# Patient Record
Sex: Female | Born: 1975 | Race: Black or African American | Hispanic: No | Smoking: Never smoker
Health system: Southern US, Community
[De-identification: ages and names within clinical notes are randomized; demographics above are authoritative.]

## PROBLEM LIST (undated history)

## (undated) DIAGNOSIS — O139 Gestational [pregnancy-induced] hypertension without significant proteinuria, unspecified trimester: Secondary | ICD-10-CM

## (undated) DIAGNOSIS — E669 Obesity, unspecified: Secondary | ICD-10-CM

## (undated) HISTORY — PX: NO PAST SURGERIES: SHX2092

## (undated) HISTORY — DX: Gestational (pregnancy-induced) hypertension without significant proteinuria, unspecified trimester: O13.9

---
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
## 2001-03-11 ENCOUNTER — Other Ambulatory Visit: Admission: RE | Admit: 2001-03-11 | Discharge: 2001-03-11 | Payer: Self-pay | Admitting: Obstetrics and Gynecology

## 2001-08-10 ENCOUNTER — Inpatient Hospital Stay (HOSPITAL_COMMUNITY): Admission: AD | Admit: 2001-08-10 | Discharge: 2001-08-13 | Payer: Self-pay | Admitting: Obstetrics and Gynecology

## 2001-10-20 ENCOUNTER — Encounter: Admission: RE | Admit: 2001-10-20 | Discharge: 2001-10-20 | Payer: Self-pay | Admitting: Obstetrics and Gynecology

## 2001-10-21 ENCOUNTER — Ambulatory Visit (HOSPITAL_COMMUNITY): Admission: RE | Admit: 2001-10-21 | Discharge: 2001-10-21 | Payer: Self-pay | Admitting: *Deleted

## 2001-10-21 ENCOUNTER — Encounter: Payer: Self-pay | Admitting: Obstetrics and Gynecology

## 2003-02-01 ENCOUNTER — Emergency Department (HOSPITAL_COMMUNITY): Admission: EM | Admit: 2003-02-01 | Discharge: 2003-02-01 | Payer: Self-pay | Admitting: Emergency Medicine

## 2003-09-25 ENCOUNTER — Other Ambulatory Visit: Admission: RE | Admit: 2003-09-25 | Discharge: 2003-09-25 | Payer: Self-pay | Admitting: Obstetrics and Gynecology

## 2004-03-22 ENCOUNTER — Inpatient Hospital Stay (HOSPITAL_COMMUNITY): Admission: AD | Admit: 2004-03-22 | Discharge: 2004-03-22 | Payer: Self-pay | Admitting: *Deleted

## 2004-03-23 ENCOUNTER — Inpatient Hospital Stay (HOSPITAL_COMMUNITY): Admission: AD | Admit: 2004-03-23 | Discharge: 2004-03-26 | Payer: Self-pay | Admitting: Family Medicine

## 2004-03-23 ENCOUNTER — Ambulatory Visit: Payer: Self-pay | Admitting: Family Medicine

## 2004-03-24 ENCOUNTER — Encounter (INDEPENDENT_AMBULATORY_CARE_PROVIDER_SITE_OTHER): Payer: Self-pay | Admitting: *Deleted

## 2007-06-02 ENCOUNTER — Emergency Department (HOSPITAL_COMMUNITY): Admission: EM | Admit: 2007-06-02 | Discharge: 2007-06-02 | Payer: Self-pay | Admitting: Emergency Medicine

## 2008-10-23 ENCOUNTER — Inpatient Hospital Stay (HOSPITAL_COMMUNITY): Admission: AD | Admit: 2008-10-23 | Discharge: 2008-10-25 | Payer: Self-pay | Admitting: Obstetrics

## 2010-02-07 ENCOUNTER — Ambulatory Visit (HOSPITAL_COMMUNITY): Admission: RE | Admit: 2010-02-07 | Discharge: 2010-02-07 | Payer: Self-pay | Admitting: Family Medicine

## 2010-10-08 LAB — CBC
HCT: 31.1 % — ABNORMAL LOW (ref 36.0–46.0)
Hemoglobin: 10.5 g/dL — ABNORMAL LOW (ref 12.0–15.0)
MCHC: 33.8 g/dL (ref 30.0–36.0)
MCV: 83.3 fL (ref 78.0–100.0)
Platelets: 205 10*3/uL (ref 150–400)
Platelets: 219 10*3/uL (ref 150–400)
RBC: 3.49 MIL/uL — ABNORMAL LOW (ref 3.87–5.11)
RBC: 3.73 MIL/uL — ABNORMAL LOW (ref 3.87–5.11)
RDW: 13.3 % (ref 11.5–15.5)
WBC: 10.8 10*3/uL — ABNORMAL HIGH (ref 4.0–10.5)
WBC: 8.1 10*3/uL (ref 4.0–10.5)

## 2010-10-08 LAB — RPR: RPR Ser Ql: NONREACTIVE

## 2011-03-26 ENCOUNTER — Inpatient Hospital Stay (INDEPENDENT_AMBULATORY_CARE_PROVIDER_SITE_OTHER)
Admission: RE | Admit: 2011-03-26 | Discharge: 2011-03-26 | Disposition: A | Discharge status: Home / Self Care | Payer: Self-pay | Source: Ambulatory Visit | Attending: Family Medicine | Admitting: Family Medicine

## 2011-03-26 DIAGNOSIS — IMO0002 Reserved for concepts with insufficient information to code with codable children: Secondary | ICD-10-CM

## 2011-04-06 LAB — COMPREHENSIVE METABOLIC PANEL WITH GFR
Albumin: 3.9
BUN: 6
Chloride: 104
Creatinine, Ser: 0.55
GFR calc non Af Amer: >60
Total Bilirubin: 1.5 — ABNORMAL HIGH

## 2011-04-06 LAB — COMPREHENSIVE METABOLIC PANEL
ALT: 25
AST: 45 — ABNORMAL HIGH
Alkaline Phosphatase: 54
CO2: 24
Calcium: 8.8
GFR calc Af Amer: >60
Glucose, Bld: 80
Potassium: 4.8
Sodium: 134 — ABNORMAL LOW
Total Protein: 7

## 2011-04-06 LAB — POCT PREGNANCY, URINE
Operator id: 239701
Preg Test, Ur: NEGATIVE

## 2011-04-06 LAB — URINALYSIS, ROUTINE W REFLEX MICROSCOPIC
Bilirubin Urine: NEGATIVE
Glucose, UA: NEGATIVE
Hgb urine dipstick: NEGATIVE
Ketones, ur: 15 — AB
Nitrite: NEGATIVE
Protein, ur: NEGATIVE
Specific Gravity, Urine: 1.007
Urobilinogen, UA: 1
pH: 7

## 2011-04-06 LAB — DIFFERENTIAL
Basophils Absolute: 0
Basophils Relative: 1
Eosinophils Absolute: 0.1 — ABNORMAL LOW
Eosinophils Relative: 2
Lymphocytes Relative: 36
Lymphs Abs: 1.9
Monocytes Absolute: 0.3
Monocytes Relative: 6
Neutro Abs: 3.1
Neutrophils Relative %: 56

## 2011-04-06 LAB — POCT URINALYSIS DIP (DEVICE)
Bilirubin Urine: NEGATIVE
Glucose, UA: NEGATIVE
Nitrite: NEGATIVE
Urobilinogen, UA: 0.2

## 2011-04-06 LAB — CBC
HCT: 37
Hemoglobin: 12.6
MCHC: 34.1
MCV: 82.9
Platelets: 295
RBC: 4.47
RDW: 13.2
WBC: 5.4

## 2011-04-06 LAB — LIPASE, BLOOD: Lipase: 15

## 2011-05-19 ENCOUNTER — Encounter: Payer: Self-pay | Admitting: Emergency Medicine

## 2011-05-19 ENCOUNTER — Emergency Department (HOSPITAL_COMMUNITY)
Admission: EM | Admit: 2011-05-19 | Discharge: 2011-05-19 | Disposition: A | Discharge status: Home / Self Care | Payer: Self-pay | Attending: Emergency Medicine | Admitting: Emergency Medicine

## 2011-05-19 ENCOUNTER — Emergency Department (HOSPITAL_COMMUNITY): Payer: Self-pay

## 2011-05-19 DIAGNOSIS — X500XXA Overexertion from strenuous movement or load, initial encounter: Secondary | ICD-10-CM | POA: Insufficient documentation

## 2011-05-19 DIAGNOSIS — M25569 Pain in unspecified knee: Secondary | ICD-10-CM | POA: Insufficient documentation

## 2011-05-19 DIAGNOSIS — IMO0002 Reserved for concepts with insufficient information to code with codable children: Secondary | ICD-10-CM | POA: Insufficient documentation

## 2011-05-19 DIAGNOSIS — S8390XA Sprain of unspecified site of unspecified knee, initial encounter: Secondary | ICD-10-CM

## 2011-05-19 MED ORDER — HYDROCODONE-ACETAMINOPHEN 5-325 MG PO TABS: 2.0000 | ORAL_TABLET | ORAL | Status: AC | PRN

## 2011-05-19 MED ORDER — IBUPROFEN 600 MG PO TABS: 600.0000 mg | ORAL_TABLET | Freq: Four times a day (QID) | ORAL | Status: AC | PRN

## 2011-05-19 NOTE — ED Notes (Signed)
Hurt her left knee a while ago and today hurts worse and  Heard a crack she states

## 2011-05-19 NOTE — ED Provider Notes (Signed)
History     CSN: [id number] Arrival date & time: 05/19/2011  4:16 PM   First MD Initiated Contact with Patient 05/19/11 1710      Chief Complaint  Patient presents with  . Knee Pain    (Consider location/radiation/quality/duration/timing/severity/associated sxs/prior treatment) Patient is a 35 y.o. female presenting with knee pain. The history is provided by the patient.  Knee Pain This is a new problem. The current episode started 12 to 24 hours ago. The problem occurs constantly. The problem has not changed since onset.The symptoms are aggravated by walking and standing. The symptoms are relieved by nothing. She has tried nothing for the symptoms.  pain localized to left knee, started after pt turned and twisted--h/o chronic knee pain  History reviewed. No pertinent past medical history.  History reviewed. No pertinent past surgical history.  No family history on file.  History  Substance Use Topics  . Smoking status: Not on file  . Smokeless tobacco: Not on file  . Alcohol Use: Not on file    OB History    Grav Para Term Preterm Abortions TAB SAB Ect Mult Living                  Review of Systems  All other systems reviewed and are negative.    Allergies  Review of patient's allergies indicates no known allergies.  Home Medications  No current outpatient prescriptions on file.  BP 135/79  Pulse 74  Temp(Src) 98.3 F (36.8 C) (Oral)  Resp 20  SpO2 100%  Physical Exam  Nursing note and vitals reviewed. Constitutional: She is oriented to person, place, and time. She appears well-developed and well-nourished.  Non-toxic appearance.  HENT:  Head: Normocephalic and atraumatic.  Eyes: Conjunctivae are normal. Pupils are equal, round, and reactive to light.  Neck: Normal range of motion.  Cardiovascular: Normal rate.   Pulmonary/Chest: Effort normal.  Musculoskeletal:       Left knee: She exhibits bony tenderness and MCL laxity. She exhibits no swelling,  no effusion, no deformity and no erythema. tenderness found.       Legs: Neurological: She is alert and oriented to person, place, and time.  Skin: Skin is warm and dry.  Psychiatric: She has a normal mood and affect.    ED Course  Procedures (including critical care time)  Labs Reviewed - No data to display No results found.   No diagnosis found.    MDM  Will give pt crutches and knee brace, given ortho referral        Toy Baker, MD 05/19/11 [phone]

## 2011-05-19 NOTE — Progress Notes (Signed)
Orthopedic Tech Progress Note Patient Details:  Sherri Butler [birth year] [id number]  Other Ortho Devices Type of Ortho Device: Knee Sleeve;Crutches Ortho Device Location: (L) LE Ortho Device Interventions: Application   [name] 05/19/2011, 6:32 PM

## 2011-07-01 ENCOUNTER — Other Ambulatory Visit (HOSPITAL_COMMUNITY): Payer: Self-pay | Admitting: Orthopedic Surgery

## 2011-07-01 DIAGNOSIS — M25562 Pain in left knee: Secondary | ICD-10-CM

## 2011-07-03 ENCOUNTER — Ambulatory Visit (HOSPITAL_COMMUNITY)
Admission: RE | Admit: 2011-07-03 | Discharge: 2011-07-03 | Disposition: A | Discharge status: Home / Self Care | Payer: Self-pay | Source: Ambulatory Visit | Attending: Orthopedic Surgery | Admitting: Orthopedic Surgery

## 2011-07-03 DIAGNOSIS — W19XXXA Unspecified fall, initial encounter: Secondary | ICD-10-CM | POA: Insufficient documentation

## 2011-07-03 DIAGNOSIS — M25569 Pain in unspecified knee: Secondary | ICD-10-CM | POA: Insufficient documentation

## 2011-07-03 DIAGNOSIS — IMO0002 Reserved for concepts with insufficient information to code with codable children: Secondary | ICD-10-CM | POA: Insufficient documentation

## 2011-07-03 DIAGNOSIS — M25562 Pain in left knee: Secondary | ICD-10-CM

## 2011-07-03 DIAGNOSIS — M25469 Effusion, unspecified knee: Secondary | ICD-10-CM | POA: Insufficient documentation

## 2012-07-21 ENCOUNTER — Encounter (HOSPITAL_COMMUNITY): Payer: Self-pay | Admitting: Emergency Medicine

## 2012-07-21 ENCOUNTER — Emergency Department (HOSPITAL_COMMUNITY)
Admission: EM | Admit: 2012-07-21 | Discharge: 2012-07-21 | Disposition: A | Discharge status: Home / Self Care | Payer: No Typology Code available for payment source | Source: Home / Self Care | Attending: Family Medicine | Admitting: Family Medicine

## 2012-07-21 DIAGNOSIS — J40 Bronchitis, not specified as acute or chronic: Secondary | ICD-10-CM

## 2012-07-21 DIAGNOSIS — J069 Acute upper respiratory infection, unspecified: Secondary | ICD-10-CM

## 2012-07-21 MED ORDER — PREDNISONE 20 MG PO TABS: ORAL_TABLET | ORAL | Status: DC

## 2012-07-21 MED ORDER — PROMETHAZINE HCL 25 MG PO TABS: 25.0000 mg | ORAL_TABLET | Freq: Three times a day (TID) | ORAL | Status: DC | PRN

## 2012-07-21 MED ORDER — CETIRIZINE-PSEUDOEPHEDRINE ER 5-120 MG PO TB12: 1.0000 | ORAL_TABLET | Freq: Two times a day (BID) | ORAL | Status: DC

## 2012-07-21 MED ORDER — HYDROCODONE-ACETAMINOPHEN 7.5-325 MG/15ML PO SOLN: 15.0000 mL | Freq: Three times a day (TID) | ORAL | Status: DC | PRN

## 2012-07-21 MED ORDER — IBUPROFEN 600 MG PO TABS: 600.0000 mg | ORAL_TABLET | Freq: Three times a day (TID) | ORAL | Status: DC | PRN

## 2012-07-21 NOTE — ED Notes (Signed)
Pt is here for cold sx x4 days.  Sx include: sore throat, dry cough, chest discomfort due to cough, fever, vomiting, nauseas, chills Denies: diarrhea, dysphagia Has taken Theraflu to w/little relief  She is alert w/no signs of acute distress.

## 2012-07-21 NOTE — ED Notes (Signed)
Pt is in the room drinking coffee.

## 2012-07-21 NOTE — ED Provider Notes (Signed)
History     CSN: [id number]  Arrival date & time 07/21/12  1230   First MD Initiated Contact with Patient 07/21/12 1256      Chief Complaint  Patient presents with  . URI    (Consider location/radiation/quality/duration/timing/severity/associated sxs/prior treatment) HPI Comments: 37 year old female with no significant past medical history. Here complaining of 4 days with sore throat, nasal congestion, sinus pressure and dry cough with coughing spells worse at nighttime associated with wheezing, nausea and vomiting. Symptoms also associated with chills and fever. Denies abdominal pain or diarrhea. Has been able to keep fluids down today. Patient taking TheraFlu over-the-counter with minimal improvement.   History reviewed. No pertinent past medical history.  History reviewed. No pertinent past surgical history.  No family history on file.  History  Substance Use Topics  . Smoking status: Not on file  . Smokeless tobacco: Not on file  . Alcohol Use: Not on file    OB History    Grav Para Term Preterm Abortions TAB SAB Ect Mult Living                  Review of Systems  Constitutional: Positive for fever, chills and appetite change. Negative for diaphoresis, fatigue and unexpected weight change.  HENT: Positive for ear pain, congestion, sore throat, rhinorrhea and sinus pressure. Negative for ear discharge.   Eyes: Negative for discharge.  Respiratory: Positive for cough and wheezing. Negative for chest tightness and shortness of breath.   Cardiovascular: Negative for chest pain and leg swelling.  Gastrointestinal: Positive for nausea and vomiting. Negative for abdominal pain.  Genitourinary: Negative for dysuria.  Neurological: Negative for dizziness and headaches.  All other systems reviewed and are negative.    Allergies  Review of patient's allergies indicates no known allergies.  Home Medications   Current Outpatient Rx  Name  Route  Sig  Dispense  Refill    . CETIRIZINE-PSEUDOEPHEDRINE ER 5-120 MG PO TB12   Oral   Take 1 tablet by mouth 2 (two) times daily.   30 tablet   0   . HYDROCODONE-ACETAMINOPHEN 7.5-325 MG/15ML PO SOLN   Oral   Take 15 mLs by mouth every 8 (eight) hours as needed for pain or cough.   120 mL   0   . IBUPROFEN 600 MG PO TABS   Oral   Take 1 tablet (600 mg total) by mouth every 8 (eight) hours as needed for pain or fever.   20 tablet   0   . PREDNISONE 20 MG PO TABS      2 tabs po daily for 5 days   10 tablet   0   . PROMETHAZINE HCL 25 MG PO TABS   Oral   Take 1 tablet (25 mg total) by mouth every 8 (eight) hours as needed for nausea.   10 tablet   0     BP 130/82  Pulse 88  Temp 98.5 F (36.9 C) (Oral)  Resp 16  SpO2 100%  Physical Exam  Nursing note and vitals reviewed. Constitutional: She is oriented to person, place, and time. She appears well-developed and well-nourished. No distress.  HENT:  Head: Normocephalic and atraumatic.       Nasal Congestion with severe erythema and swelling of nasal turbinates, clear rhinorrhea. Nasal voice.  pharyngeal erythema, postnasal drip no exudates. No uvula deviation. No trismus. TM's impress clear fluid behind right TM. Left TM appears normal.  Eyes: Conjunctivae normal are normal. Right eye exhibits no  discharge. Left eye exhibits no discharge. No scleral icterus.  Neck: Neck supple. No JVD present. No thyromegaly present.  Cardiovascular: Normal rate, regular rhythm and normal heart sounds.  Exam reveals no gallop and no friction rub.   No murmur heard. Pulmonary/Chest: Effort normal. She exhibits no tenderness.       Scattered expiratory rhonchi. No active wheezing. No orthopnea or tachypnea. No rales.  Abdominal: Soft. She exhibits no distension and no mass. There is no tenderness. There is no rebound and no guarding.  Lymphadenopathy:    She has no cervical adenopathy.  Neurological: She is alert and oriented to person, place, and time.  Skin:  No rash noted. She is not diaphoretic.    ED Course  Procedures (including critical care time)  Labs Reviewed - No data to display No results found.   1. Bronchitis   2. URI (upper respiratory infection)       MDM  Impress viral upper respiratory infection and bronchitis. Prescribed prednisone, cetirizine/pseudoephedrine, ibuprofen, hydrocodone and/acetaminophen and Phenergan. Supportive care and red flags that should prompt her return to medical attention discussed with patient and provided in writing.        Sharin Grave, MD 07/22/12 1210

## 2013-05-12 ENCOUNTER — Ambulatory Visit: Payer: No Typology Code available for payment source

## 2013-06-06 ENCOUNTER — Encounter (HOSPITAL_COMMUNITY): Payer: Self-pay | Admitting: Emergency Medicine

## 2013-06-06 ENCOUNTER — Emergency Department (HOSPITAL_COMMUNITY)
Admission: EM | Admit: 2013-06-06 | Discharge: 2013-06-06 | Disposition: A | Discharge status: Home / Self Care | Payer: No Typology Code available for payment source | Source: Home / Self Care | Attending: Family Medicine | Admitting: Family Medicine

## 2013-06-06 DIAGNOSIS — J069 Acute upper respiratory infection, unspecified: Secondary | ICD-10-CM

## 2013-06-06 MED ORDER — BENZONATATE 100 MG PO CAPS: 100.0000 mg | ORAL_CAPSULE | Freq: Three times a day (TID) | ORAL | Status: DC

## 2013-06-06 NOTE — ED Notes (Signed)
C/o sore throat.  Productive cough with with red tinged sputum. Chest soreness. And fever.  Denies n/v/d.  Mild relief with otc meds.  Onset 2 wks ago.

## 2013-06-06 NOTE — ED Provider Notes (Signed)
CSN: [id number]     Arrival date & time 06/06/13  4098 History   First MD Initiated Contact with Patient 06/06/13 [phone]     Chief Complaint  Patient presents with  . URI   (Consider location/radiation/quality/duration/timing/severity/associated sxs/prior Treatment) HPI Comments: Patient states, "I have a cold."  Patient is a 37 y.o. female presenting with URI. The history is provided by the patient.  URI Presenting symptoms: congestion, cough, rhinorrhea and sore throat   Presenting symptoms: no ear pain, no facial pain, no fatigue and no fever   Severity:  Mild Onset quality:  Gradual Duration:  3 days Timing:  Constant Progression:  Unchanged Chronicity:  New Relieved by:  OTC medications Associated symptoms: no arthralgias, no headaches, no myalgias, no neck pain, no sinus pain, no sneezing, no swollen glands and no wheezing   Risk factors: not elderly, no immunosuppression, no recent illness, no recent travel and no sick contacts     History reviewed. No pertinent past medical history. History reviewed. No pertinent past surgical history. History reviewed. No pertinent family history. History  Substance Use Topics  . Smoking status: Never Smoker   . Smokeless tobacco: Not on file  . Alcohol Use: No   OB History   Grav Para Term Preterm Abortions TAB SAB Ect Mult Living                 Review of Systems  Constitutional: Negative for fever and fatigue.  HENT: Positive for congestion, rhinorrhea and sore throat. Negative for ear pain and sneezing.   Respiratory: Positive for cough. Negative for wheezing.   Musculoskeletal: Negative for arthralgias, myalgias and neck pain.  Neurological: Negative for headaches.  All other systems reviewed and are negative.    Allergies  Review of patient's allergies indicates no known allergies.  Home Medications   Current Outpatient Rx  Name  Route  Sig  Dispense  Refill  . benzonatate (TESSALON) 100 MG capsule   Oral   Take  1 capsule (100 mg total) by mouth every 8 (eight) hours.   21 capsule   0   . cetirizine-pseudoephedrine (ZYRTEC-D) 5-120 MG per tablet   Oral   Take 1 tablet by mouth 2 (two) times daily.   30 tablet   0   . HYDROcodone-acetaminophen (HYCET) 7.5-325 mg/15 ml solution   Oral   Take 15 mLs by mouth every 8 (eight) hours as needed for pain or cough.   120 mL   0   . ibuprofen (ADVIL,MOTRIN) 600 MG tablet   Oral   Take 1 tablet (600 mg total) by mouth every 8 (eight) hours as needed for pain or fever.   20 tablet   0   . predniSONE (DELTASONE) 20 MG tablet      2 tabs po daily for 5 days   10 tablet   0   . promethazine (PHENERGAN) 25 MG tablet   Oral   Take 1 tablet (25 mg total) by mouth every 8 (eight) hours as needed for nausea.   10 tablet   0    BP 119/81  Pulse 68  Temp(Src) 98.1 F (36.7 C) (Oral)  Resp 16  SpO2 100%  LMP 05/07/2013 Physical Exam  Nursing note and vitals reviewed. Constitutional: She is oriented to person, place, and time. She appears well-developed and well-nourished. No distress.  HENT:  Head: Normocephalic and atraumatic.  Right Ear: Hearing, tympanic membrane, external ear and ear canal normal.  Left Ear: Hearing, tympanic membrane,  external ear and ear canal normal.  Nose: Nose normal.  Mouth/Throat: Uvula is midline, oropharynx is clear and moist and mucous membranes are normal.  Eyes: Conjunctivae are normal. Pupils are equal, round, and reactive to light. Right eye exhibits no discharge. Left eye exhibits no discharge. No scleral icterus.  Neck: Normal range of motion. Neck supple. No thyromegaly present.  Cardiovascular: Normal rate, regular rhythm and normal heart sounds.   Pulmonary/Chest: Effort normal and breath sounds normal. No respiratory distress.  Abdominal: Soft. Bowel sounds are normal. There is no tenderness.  Musculoskeletal: Normal range of motion.  Lymphadenopathy:    She has no cervical adenopathy.   Neurological: She is alert and oriented to person, place, and time.  Skin: Skin is warm and dry. No rash noted.  Psychiatric: She has a normal mood and affect. Her behavior is normal.    ED Course  Procedures (including critical care time) Labs Review Labs Reviewed  POCT RAPID STREP A ([facility] CARE ONLY)   Imaging Review No results found.  EKG Interpretation    Date/Time:    Ventricular Rate:    PR Interval:    QRS Duration:   QT Interval:    QTC Calculation:   R Axis:     Text Interpretation:              MDM   1. URI (upper respiratory infection)     Rapid strep negative. Advised patient regarding symptomatic care and expectation for resolution of symptoms at home. Instructed her to follow up with PCP if symptoms do not begin to improve over the next 7-10 days or if she develops fever > 101.    Jess Barters [place], [place] 06/06/13 1032

## 2013-06-07 NOTE — ED Provider Notes (Signed)
Medical screening examination/treatment/procedure(s) were performed by a resident physician or non-physician practitioner and as the supervising physician I was immediately available for consultation/collaboration.  Clementeen Graham, MD    Rodolph Bong, MD 06/07/13 [phone]

## 2013-06-08 LAB — CULTURE, GROUP A STREP

## 2014-05-15 LAB — CYTOLOGY - PAP: Pap: NEGATIVE

## 2014-06-05 LAB — OB RESULTS CONSOLE ABO/RH: RH Type: POSITIVE

## 2014-06-05 LAB — OB RESULTS CONSOLE GC/CHLAMYDIA
Chlamydia: NEGATIVE
GC PROBE AMP, GENITAL: NEGATIVE

## 2014-06-05 LAB — OB RESULTS CONSOLE HEPATITIS B SURFACE ANTIGEN: Hepatitis B Surface Ag: NEGATIVE

## 2014-06-05 LAB — OB RESULTS CONSOLE RUBELLA ANTIBODY, IGM: Rubella: IMMUNE

## 2014-06-05 LAB — OB RESULTS CONSOLE RPR: RPR: NONREACTIVE

## 2014-06-05 LAB — OB RESULTS CONSOLE HIV ANTIBODY (ROUTINE TESTING): HIV: NONREACTIVE

## 2014-06-05 LAB — OB RESULTS CONSOLE ANTIBODY SCREEN: ANTIBODY SCREEN: NEGATIVE

## 2014-06-29 DIAGNOSIS — O139 Gestational [pregnancy-induced] hypertension without significant proteinuria, unspecified trimester: Secondary | ICD-10-CM | POA: Insufficient documentation

## 2014-06-29 HISTORY — DX: Gestational (pregnancy-induced) hypertension without significant proteinuria, unspecified trimester: O13.9

## 2014-06-29 NOTE — L&D Delivery Note (Signed)
Delivery Note At 9:52 PM a viable female was delivered via Vaginal, Spontaneous Delivery (Presentation: Right Occiput Anterior).  APGAR: 9, 9; weight  .   Placenta status: , .  Cord: 3 vessels with the following complications: None.  Cord pH: not done  Anesthesia: Epidural  Episiotomy: None Lacerations: None Suture Repair: 2.0 Est. Blood Loss (mL):    Mom to postpartum.  Baby to Couplet care / Skin to Skin.  Tito Ausmus A 12/09/2014, 10:09 PM

## 2014-07-10 ENCOUNTER — Other Ambulatory Visit (HOSPITAL_COMMUNITY): Payer: Self-pay | Admitting: Obstetrics

## 2014-07-10 DIAGNOSIS — Z3689 Encounter for other specified antenatal screening: Secondary | ICD-10-CM

## 2014-07-10 DIAGNOSIS — O09522 Supervision of elderly multigravida, second trimester: Secondary | ICD-10-CM

## 2014-07-10 DIAGNOSIS — Z3A20 20 weeks gestation of pregnancy: Secondary | ICD-10-CM

## 2014-07-19 ENCOUNTER — Encounter (HOSPITAL_COMMUNITY): Payer: Self-pay

## 2014-07-19 ENCOUNTER — Ambulatory Visit (HOSPITAL_COMMUNITY)
Admission: RE | Admit: 2014-07-19 | Discharge: 2014-07-19 | Disposition: A | Discharge status: Home / Self Care | Payer: Self-pay | Source: Ambulatory Visit | Attending: Obstetrics | Admitting: Obstetrics

## 2014-07-19 DIAGNOSIS — Z3689 Encounter for other specified antenatal screening: Secondary | ICD-10-CM | POA: Insufficient documentation

## 2014-07-19 DIAGNOSIS — O09522 Supervision of elderly multigravida, second trimester: Secondary | ICD-10-CM

## 2014-07-19 DIAGNOSIS — Z3A20 20 weeks gestation of pregnancy: Secondary | ICD-10-CM

## 2014-07-19 DIAGNOSIS — O09529 Supervision of elderly multigravida, unspecified trimester: Secondary | ICD-10-CM | POA: Insufficient documentation

## 2014-07-19 DIAGNOSIS — Z315 Encounter for genetic counseling: Secondary | ICD-10-CM | POA: Insufficient documentation

## 2014-07-19 NOTE — Progress Notes (Signed)
Genetic Counseling  High-Risk Gestation Note  Appointment Date:  07/19/2014 Referred By: Kathreen CosierMarshall, Bernard A, MD Date of Birth:  Jul 27, 1975   Pregnancy History: [id number] Estimated Date of Delivery: 12/06/14 Estimated Gestational Age: 5519w0d Attending: Alpha GulaPaul Whitecar, MD   [name] was seen for genetic counseling because of a maternal age of 39 y.o..     She was counseled regarding maternal age and the association with risk for chromosome conditions due to nondisjunction with aging of the ova.   We reviewed chromosomes, nondisjunction, and the associated 1 in 111 risk for fetal aneuploidy related to a maternal age of 39 y.o. at 4719w0d gestation.  She was/They were counseled that the risk for aneuploidy decreases as gestational age increases, accounting for those pregnancies which spontaneously abort.  We specifically discussed Down syndrome (trisomy 4721), trisomies 6613 and 4418, and sex chromosome aneuploidies (47,XXX and 47,XXY) including the common features and prognoses of each.   We reviewed available screening options including Quad screen, noninvasive prenatal screening (NIPS)/cell free DNA (cfDNA) testing, and detailed ultrasound.  She was counseled that screening tests are used to modify a patient's a priori risk for aneuploidy, typically based on age. This estimate provides a pregnancy specific risk assessment. We reviewed the benefits and limitations of each option. Specifically, we discussed the conditions for which each test screens, the detection rates, and false positive rates of each. She was also counseled regarding diagnostic testing via amniocentesis. We reviewed the approximate 1 in 300-500 risk for complications for amniocentesis, including spontaneous pregnancy loss. After consideration of all the options, she elected to proceed with targeted ultrasound only and declined Quad screen, NIPS, and amniocentesis.    A complete ultrasound was performed today. The ultrasound report will be  sent under separate cover. There were no visualized fetal anomalies or markers suggestive of aneuploidy. Diagnostic testing was declined today.  She understands that screening tests cannot rule out all birth defects or genetic syndromes. The patient was advised of this limitation and states she still does not want additional testing at this time.   Ms. Fonnie MuHaoua Koogler was provided with written information regarding sickle cell anemia (SCA) including the carrier frequency and incidence in the African population, the availability of carrier testing and prenatal diagnosis if indicated.  In addition, we discussed that hemoglobinopathies are routinely screened for as part of the [place] newborn screening panel.  OB medical records indicated that sickle cell screening was previously performed and was negative. Hemoglobin electrophoresis is available to screen for additional hemoglobin variants, if not previously performed and if desired by the patient.   Both family histories were reviewed and found to be noncontributory for birth defects, intellectual disability, and known genetic conditions. Without further information regarding the provided family history, an accurate genetic risk cannot be calculated. Further genetic counseling is warranted if more information is obtained.  Ms. Fonnie MuHaoua Huyser denied exposure to environmental toxins or chemical agents. She denied the use of alcohol, tobacco or street drugs. She denied significant viral illnesses during the course of her pregnancy. Her medical and surgical histories were noncontributory.   I counseled Ms. Carin Duplessis regarding the above risks and available options.  The approximate face-to-face time with the genetic counselor was 35 minutes.  Quinn PlowmanKaren Franceen Erisman, MS,  Certified Genetic Counselor 07/19/2014

## 2014-08-17 ENCOUNTER — Ambulatory Visit (HOSPITAL_COMMUNITY)
Admission: RE | Admit: 2014-08-17 | Discharge: 2014-08-17 | Disposition: A | Discharge status: Home / Self Care | Payer: Self-pay | Source: Ambulatory Visit | Attending: Obstetrics | Admitting: Obstetrics

## 2014-08-17 ENCOUNTER — Encounter (HOSPITAL_COMMUNITY): Payer: Self-pay

## 2014-08-17 DIAGNOSIS — Z0489 Encounter for examination and observation for other specified reasons: Secondary | ICD-10-CM | POA: Insufficient documentation

## 2014-08-17 DIAGNOSIS — O09522 Supervision of elderly multigravida, second trimester: Secondary | ICD-10-CM

## 2014-08-17 DIAGNOSIS — IMO0002 Reserved for concepts with insufficient information to code with codable children: Secondary | ICD-10-CM | POA: Insufficient documentation

## 2014-08-17 DIAGNOSIS — Z3A24 24 weeks gestation of pregnancy: Secondary | ICD-10-CM | POA: Insufficient documentation

## 2014-08-17 DIAGNOSIS — Z36 Encounter for antenatal screening of mother: Secondary | ICD-10-CM | POA: Insufficient documentation

## 2014-08-27 ENCOUNTER — Inpatient Hospital Stay (HOSPITAL_COMMUNITY)
Admission: AD | Admit: 2014-08-27 | Discharge: 2014-08-27 | Disposition: A | Discharge status: Home / Self Care | Payer: Self-pay | Source: Ambulatory Visit | Attending: Obstetrics | Admitting: Obstetrics

## 2014-08-27 ENCOUNTER — Encounter (HOSPITAL_COMMUNITY): Payer: Self-pay | Admitting: General Practice

## 2014-08-27 DIAGNOSIS — S3992XA Unspecified injury of lower back, initial encounter: Secondary | ICD-10-CM

## 2014-08-27 DIAGNOSIS — O9989 Other specified diseases and conditions complicating pregnancy, childbirth and the puerperium: Secondary | ICD-10-CM | POA: Insufficient documentation

## 2014-08-27 DIAGNOSIS — O09522 Supervision of elderly multigravida, second trimester: Secondary | ICD-10-CM | POA: Insufficient documentation

## 2014-08-27 DIAGNOSIS — W108XXA Fall (on) (from) other stairs and steps, initial encounter: Secondary | ICD-10-CM

## 2014-08-27 DIAGNOSIS — O9A212 Injury, poisoning and certain other consequences of external causes complicating pregnancy, second trimester: Secondary | ICD-10-CM

## 2014-08-27 DIAGNOSIS — Z3A25 25 weeks gestation of pregnancy: Secondary | ICD-10-CM | POA: Insufficient documentation

## 2014-08-27 DIAGNOSIS — W19XXXA Unspecified fall, initial encounter: Secondary | ICD-10-CM

## 2014-08-27 DIAGNOSIS — W109XXA Fall (on) (from) unspecified stairs and steps, initial encounter: Secondary | ICD-10-CM | POA: Insufficient documentation

## 2014-08-27 LAB — ABO/RH: ABO/RH(D): O POS

## 2014-08-27 NOTE — MAU Provider Note (Signed)
History     CSN: [id number]  Arrival date and time: 08/27/14 [id number]   First Provider Initiated Contact with Patient 08/27/14 [phone]      Chief Complaint  Patient presents with  . Fall   HPI   [name] is a 39 y.o. female [phone] at 6936w4d who presents following a fall that she encounters last evening at 6:00 pm.  She was coming down the stairs and missed the last two steps. She fell on her bottom; she did not hit her abdomen.  She is having pain only in her bottom; she feels the pain when she sits and when she changes positions. She denies bruising or injury.   She denies leaking of fluid or vaginal bleeding.   + fetal movements.   OB History    Gravida Para Term Preterm AB TAB SAB Ectopic Multiple Living   4 3 3       3       Past Medical History  Diagnosis Date  . Medical history non-contributory     Past Surgical History  Procedure Laterality Date  . No past surgeries      History reviewed. No pertinent family history.  History  Substance Use Topics  . Smoking status: Never Smoker   . Smokeless tobacco: Not on file  . Alcohol Use: No    Allergies: No Known Allergies  Prescriptions prior to admission  Medication Sig Dispense Refill Last Dose  . benzonatate (TESSALON) 100 MG capsule Take 1 capsule (100 mg total) by mouth every 8 (eight) hours. (Patient not taking: Reported on 07/19/2014) 21 capsule 0 Not Taking  . cetirizine-pseudoephedrine (ZYRTEC-D) 5-120 MG per tablet Take 1 tablet by mouth 2 (two) times daily. (Patient not taking: Reported on 07/19/2014) 30 tablet 0 Not Taking  . HYDROcodone-acetaminophen (HYCET) 7.5-325 mg/15 ml solution Take 15 mLs by mouth every 8 (eight) hours as needed for pain or cough. (Patient not taking: Reported on 07/19/2014) 120 mL 0 Not Taking  . ibuprofen (ADVIL,MOTRIN) 600 MG tablet Take 1 tablet (600 mg total) by mouth every 8 (eight) hours as needed for pain or fever. (Patient not taking: Reported on 07/19/2014) 20 tablet 0  Not Taking  . predniSONE (DELTASONE) 20 MG tablet 2 tabs po daily for 5 days (Patient not taking: Reported on 07/19/2014) 10 tablet 0 Not Taking  . Prenatal Multivit-Min-Fe-FA (PRENATAL VITAMINS PO) Take by mouth.   Taking  . promethazine (PHENERGAN) 25 MG tablet Take 1 tablet (25 mg total) by mouth every 8 (eight) hours as needed for nausea. (Patient not taking: Reported on 07/19/2014) 10 tablet 0 Not Taking   Results for orders placed or performed during the hospital encounter of 08/27/14 (from the past 48 hour(s))  ABO/Rh     Status: None (Preliminary result)   Collection Time: 08/27/14  9:20 AM  Result Value Ref Range   ABO/RH(D) O POS     Review of Systems  Constitutional: Negative for fever and chills.  Gastrointestinal: Negative for abdominal pain.  Musculoskeletal: Negative for back pain.   Physical Exam   Blood pressure 123/64, pulse 81, temperature 98.3 F (36.8 C), resp. rate 18, height 5\' 3"  (1.6 m).  Physical Exam  Nursing note and vitals reviewed. Constitutional: She is oriented to person, place, and time. She appears well-developed and well-nourished. No distress.  HENT:  Head: Normocephalic.  Eyes: Pupils are equal, round, and reactive to light.  Neck: Neck supple.  Cardiovascular: Normal rate and normal heart sounds.  Respiratory: Effort normal and breath sounds normal. No respiratory distress.  GI: Soft. She exhibits no distension. There is no tenderness. There is no rebound and no guarding.  Musculoskeletal: Normal range of motion.  Neurological: She is alert and oriented to person, place, and time.  Skin: Skin is warm. She is not diaphoretic.  Psychiatric: Her behavior is normal.   Fetal Tracing: Baseline: 135 bpm  Variability: Moderate  Accelerations: 10x10 Decelerations: None Toco: None  MAU Course  Procedures  None  MDM O positive  Discussed patient with Dr. Gaynell Face.   NST   Assessment and Plan   A:  1. Fall, initial encounter      P:  Discharge home in stable condition Follow up with Dr. Gaynell Face as scheduled Return to MAU with any pain or bleeding, or leaking of fluid Fetal kick counts   Iona Hansen Alexander Aument, NP 08/27/2014 10:25 AM

## 2014-08-27 NOTE — MAU Note (Signed)
Slipped going down the stairs last night.  Landed on her bottom, no pain except when she goes to sit down

## 2014-08-27 NOTE — MAU Note (Signed)
Fetus difficult to trace due to active fetal movement

## 2014-08-27 NOTE — Discharge Instructions (Signed)
Fall Prevention and Home Safety °Falls cause injuries and can affect all age groups. It is possible to use preventive measures to significantly decrease the likelihood of falls. There are many simple measures which can make your home safer and prevent falls. °OUTDOORS °· Repair cracks and edges of walkways and driveways. °· Remove high doorway thresholds. °· Trim shrubbery on the main path into your home. °· Have good outside lighting. °· Clear walkways of tools, rocks, debris, and clutter. °· Check that handrails are not broken and are securely fastened. Both sides of steps should have handrails. °· Have leaves, snow, and ice cleared regularly. °· Use sand or salt on walkways during winter months. °· In the garage, clean up grease or oil spills. °BATHROOM °· Install night lights. °· Install grab bars by the toilet and in the tub and shower. °· Use non-skid mats or decals in the tub or shower. °· Place a plastic non-slip stool in the shower to sit on, if needed. °· Keep floors dry and clean up all water on the floor immediately. °· Remove soap buildup in the tub or shower on a regular basis. °· Secure bath mats with non-slip, double-sided rug tape. °· Remove throw rugs and tripping hazards from the floors. °BEDROOMS °· Install night lights. °· Make sure a bedside light is easy to reach. °· Do not use oversized bedding. °· Keep a telephone by your bedside. °· Have a firm chair with side arms to use for getting dressed. °· Remove throw rugs and tripping hazards from the floor. °KITCHEN °· Keep handles on pots and pans turned toward the center of the stove. Use back burners when possible. °· Clean up spills quickly and allow time for drying. °· Avoid walking on wet floors. °· Avoid hot utensils and knives. °· Position shelves so they are not too high or low. °· Place commonly used objects within easy reach. °· If necessary, use a sturdy step stool with a grab bar when reaching. °· Keep electrical cables out of the  way. °· Do not use floor polish or wax that makes floors slippery. If you must use wax, use non-skid floor wax. °· Remove throw rugs and tripping hazards from the floor. °STAIRWAYS °· Never leave objects on stairs. °· Place handrails on both sides of stairways and use them. Fix any loose handrails. Make sure handrails on both sides of the stairways are as long as the stairs. °· Check carpeting to make sure it is firmly attached along stairs. Make repairs to worn or loose carpet promptly. °· Avoid placing throw rugs at the top or bottom of stairways, or properly secure the rug with carpet tape to prevent slippage. Get rid of throw rugs, if possible. °· Have an electrician put in a light switch at the top and bottom of the stairs. °OTHER FALL PREVENTION TIPS °· Wear low-heel or rubber-soled shoes that are supportive and fit well. Wear closed toe shoes. °· When using a stepladder, make sure it is fully opened and both spreaders are firmly locked. Do not climb a closed stepladder. °· Add color or contrast paint or tape to grab bars and handrails in your home. Place contrasting color strips on first and last steps. °· Learn and use mobility aids as needed. Install an electrical emergency response system. °· Turn on lights to avoid dark areas. Replace light bulbs that burn out immediately. Get light switches that glow. °· Arrange furniture to create clear pathways. Keep furniture in the same place. °·   Firmly attach carpet with non-skid or double-sided tape. °· Eliminate uneven floor surfaces. °· Select a carpet pattern that does not visually hide the edge of steps. °· Be aware of all pets. °OTHER HOME SAFETY TIPS °· Set the water temperature for 120° F (48.8° C). °· Keep emergency numbers on or near the telephone. °· Keep smoke detectors on every level of the home and near sleeping areas. °Document Released: 06/05/2002 Document Revised: 12/15/2011 Document Reviewed: 09/04/2011 °ExitCare® Patient Information ©2015  [employer]. This information is not intended to replace advice given to you by your health care provider. Make sure you discuss any questions you have with your health care provider. ° °What Do I Need to Know About Injuries During Pregnancy? °Trauma is the most common cause of injury and death in pregnant women. This can also result in significant harm or death of the baby. °Your baby is protected in the womb (uterus) by a sac filled with fluid (amniotic sac). Your baby can be harmed if there is direct, high-impact trauma to your abdomen and pelvis. This type of trauma can result in tearing of your uterus, the placenta pulling away from the wall of the uterus (placenta abruption), or the amniotic sac breaking open (rupture of membranes). These injuries can decrease or stop the blood supply to your baby or cause you to go into labor earlier than expected. Minor falls and low-impact automobile accidents do not usually harm your baby, even if they do minimally harm you. °WHAT KIND OF INJURIES CAN AFFECT MY PREGNANCY? °The most common causes of injury or death to a baby include: °· Falls. Falls are more common in the second and third trimester of the pregnancy. Factors that increase your risk of falling include: °¨ Increase in your weight. °¨ The change in your center of gravity. °¨ Tripping over an object that cannot be seen. °¨ Increased looseness (laxity) of your ligaments resulting in less coordinated movements (you may feel clumsy). °¨ Falling during high-risk activities like horseback riding or skiing. °· Automobile accidents. It is important to wear your seat belt properly, with the lap belt below your abdomen, and always practice safe driving. °· Domestic violence or assault. °· Burns (fire or electrical). °The most common causes of injury or death to the pregnant woman include: °· Injuries that cause severe bleeding, shock, and loss of blood flow to major organs. °· Head and neck injuries that result in  severe brain or spinal damage. °· Chest trauma that can cause direct injury to the heart and lungs or any injury that affects the area enclosed by the ribs. Trauma to this area can result in cardiorespiratory arrest. °WHAT CAN I DO TO PROTECT MYSELF AND MY BABY FROM INJURY WHILE I AM PREGNANT? °· Remove slippery rugs and loose objects on the floor that increase your risk of tripping. °· Avoid walking on wet or slippery floors. °· Wear comfortable shoes that have a good grip on the sole. Do not wear high-heeled shoes. °· Always wear your seat belt properly, with the lap belt below your abdomen, and always practice safe driving. Do not ride on a motorcycle while pregnant. °· Do not participate in high-impact activities or sports. °· Avoid fires, starting fires, lifting heavy pots of boiling or hot liquids, and fixing electrical problems. °· Only take over-the-counter or prescription medicines for pain, fever, or discomfort as directed by your health care provider. °· Know your blood type and the father's blood type in case you develop vaginal   bleeding or experience an injury for which a blood transfusion may be necessary. °· Call your local emergency services (911 in the [place] if you are a victim of domestic violence or assault. Spousal abuse can be a significant cause of trauma during pregnancy. For help and support, contact the National Domestic Violence Hotline. °WHEN SHOULD I SEEK IMMEDIATE MEDICAL CARE?  °· You fall on your abdomen or experience any high-force accident or injury. °· You have been assaulted (domestic or otherwise). °· You have been in a car accident. °· You develop vaginal bleeding. °· You develop fluid leaking from the vagina. °· You develop uterine contractions (pelvic cramping, pain, or significant low back pain). °· You become weak or faint, or have uncontrolled vomiting after trauma. °· You had a serious burn. This includes burns to the face, neck, hands, or genitals, or burns greater than  the size of your palm anywhere else. °· You develop neck stiffness or pain after a fall or from other trauma. °· You develop a headache or vision problems after a fall or from other trauma. °· You do not feel the baby moving or the baby is not moving as much as before a fall or other trauma. °Document Released: 07/23/2004 Document Revised: 10/30/2013 Document Reviewed: 03/22/2013 °ExitCare® Patient Information ©2015 [employer]. This information is not intended to replace advice given to you by your health care provider. Make sure you discuss any questions you have with your health care provider. ° °

## 2014-08-27 NOTE — MAU Note (Signed)
Urine in lab 

## 2014-10-30 LAB — OB RESULTS CONSOLE GBS: STREP GROUP B AG: POSITIVE

## 2014-12-06 ENCOUNTER — Telehealth (HOSPITAL_COMMUNITY): Payer: Self-pay | Admitting: *Deleted

## 2014-12-06 ENCOUNTER — Encounter (HOSPITAL_COMMUNITY): Payer: Self-pay | Admitting: *Deleted

## 2014-12-06 NOTE — Telephone Encounter (Signed)
Preadmission screen  

## 2014-12-09 ENCOUNTER — Inpatient Hospital Stay (HOSPITAL_COMMUNITY): Payer: Medicaid Other | Admitting: Anesthesiology

## 2014-12-09 ENCOUNTER — Inpatient Hospital Stay (HOSPITAL_COMMUNITY)
Admission: RE | Admit: 2014-12-09 | Discharge: 2014-12-11 | DRG: 775 | Disposition: A | Discharge status: Home / Self Care | Payer: Medicaid Other | Source: Ambulatory Visit | Attending: Obstetrics | Admitting: Obstetrics

## 2014-12-09 ENCOUNTER — Encounter (HOSPITAL_COMMUNITY): Payer: Self-pay

## 2014-12-09 DIAGNOSIS — Z3490 Encounter for supervision of normal pregnancy, unspecified, unspecified trimester: Secondary | ICD-10-CM

## 2014-12-09 DIAGNOSIS — O99824 Streptococcus B carrier state complicating childbirth: Secondary | ICD-10-CM | POA: Diagnosis present

## 2014-12-09 DIAGNOSIS — Z3A40 40 weeks gestation of pregnancy: Secondary | ICD-10-CM | POA: Diagnosis present

## 2014-12-09 DIAGNOSIS — O09523 Supervision of elderly multigravida, third trimester: Secondary | ICD-10-CM

## 2014-12-09 DIAGNOSIS — O480 Post-term pregnancy: Principal | ICD-10-CM | POA: Diagnosis present

## 2014-12-09 LAB — TYPE AND SCREEN
ABO/RH(D): O POS
Antibody Screen: NEGATIVE

## 2014-12-09 LAB — CBC
HEMATOCRIT: 28.8 % — AB (ref 36.0–46.0)
Hemoglobin: 9.5 g/dL — ABNORMAL LOW (ref 12.0–15.0)
MCH: 25.2 pg — ABNORMAL LOW (ref 26.0–34.0)
MCHC: 33 g/dL (ref 30.0–36.0)
MCV: 76.4 fL — AB (ref 78.0–100.0)
PLATELETS: 236 10*3/uL (ref 150–400)
RBC: 3.77 MIL/uL — AB (ref 3.87–5.11)
RDW: 13.9 % (ref 11.5–15.5)
WBC: 8.7 10*3/uL (ref 4.0–10.5)

## 2014-12-09 MED ORDER — ONDANSETRON HCL 4 MG/2ML IJ SOLN
4.0000 mg | Freq: Four times a day (QID) | INTRAMUSCULAR | Status: DC | PRN
  Administered 2014-12-09: 4 mg via INTRAVENOUS
  Filled 2014-12-09: qty 2

## 2014-12-09 MED ORDER — OXYCODONE-ACETAMINOPHEN 5-325 MG PO TABS: 2.0000 | ORAL_TABLET | ORAL | Status: DC | PRN

## 2014-12-09 MED ORDER — LIDOCAINE-EPINEPHRINE (PF) 2 %-1:200000 IJ SOLN
INTRAMUSCULAR | Status: DC | PRN
  Administered 2014-12-09: 3 mL

## 2014-12-09 MED ORDER — BUPIVACAINE HCL (PF) 0.25 % IJ SOLN
INTRAMUSCULAR | Status: DC | PRN
  Administered 2014-12-09 (×2): 4 mL

## 2014-12-09 MED ORDER — OXYTOCIN 40 UNITS IN LACTATED RINGERS INFUSION - SIMPLE MED
1.0000 m[IU]/min | INTRAVENOUS | Status: DC
  Administered 2014-12-09: 4 m[IU]/min via INTRAVENOUS
  Administered 2014-12-09: 10 m[IU]/min via INTRAVENOUS
  Administered 2014-12-09: 6 m[IU]/min via INTRAVENOUS
  Administered 2014-12-09: 8 m[IU]/min via INTRAVENOUS
  Administered 2014-12-09: 2 m[IU]/min via INTRAVENOUS
  Filled 2014-12-09: qty 1000

## 2014-12-09 MED ORDER — OXYTOCIN BOLUS FROM INFUSION: 500.0000 mL | INTRAVENOUS | Status: DC

## 2014-12-09 MED ORDER — PENICILLIN G POTASSIUM 5000000 UNITS IJ SOLR
2.5000 10*6.[IU] | INTRAVENOUS | Status: DC
  Administered 2014-12-09 (×3): 2.5 10*6.[IU] via INTRAVENOUS
  Filled 2014-12-09 (×8): qty 2.5

## 2014-12-09 MED ORDER — OXYCODONE-ACETAMINOPHEN 5-325 MG PO TABS: 1.0000 | ORAL_TABLET | ORAL | Status: DC | PRN

## 2014-12-09 MED ORDER — LACTATED RINGERS IV SOLN
INTRAVENOUS | Status: DC
  Administered 2014-12-09: 500 mL via INTRAVENOUS

## 2014-12-09 MED ORDER — EPHEDRINE 5 MG/ML INJ
10.0000 mg | INTRAVENOUS | Status: DC | PRN
  Filled 2014-12-09: qty 2

## 2014-12-09 MED ORDER — LIDOCAINE HCL (PF) 1 % IJ SOLN
30.0000 mL | INTRAMUSCULAR | Status: DC | PRN
  Filled 2014-12-09: qty 30

## 2014-12-09 MED ORDER — TERBUTALINE SULFATE 1 MG/ML IJ SOLN: 0.2500 mg | Freq: Once | INTRAMUSCULAR | Status: AC | PRN

## 2014-12-09 MED ORDER — DIPHENHYDRAMINE HCL 50 MG/ML IJ SOLN: 12.5000 mg | INTRAMUSCULAR | Status: DC | PRN

## 2014-12-09 MED ORDER — FENTANYL 2.5 MCG/ML BUPIVACAINE 1/10 % EPIDURAL INFUSION (WH - ANES)
14.0000 mL/h | INTRAMUSCULAR | Status: DC | PRN
  Administered 2014-12-09 (×2): 14 mL/h via EPIDURAL
  Filled 2014-12-09: qty 125

## 2014-12-09 MED ORDER — PENICILLIN G POTASSIUM 5000000 UNITS IJ SOLR
5.0000 10*6.[IU] | Freq: Once | INTRAVENOUS | Status: AC
  Administered 2014-12-09: 5 10*6.[IU] via INTRAVENOUS
  Filled 2014-12-09: qty 5

## 2014-12-09 MED ORDER — PHENYLEPHRINE 40 MCG/ML (10ML) SYRINGE FOR IV PUSH (FOR BLOOD PRESSURE SUPPORT)
80.0000 ug | PREFILLED_SYRINGE | INTRAVENOUS | Status: DC | PRN
  Administered 2014-12-09: 40 ug via INTRAVENOUS
  Filled 2014-12-09: qty 2
  Filled 2014-12-09: qty 20

## 2014-12-09 MED ORDER — FLEET ENEMA 7-19 GM/118ML RE ENEM: 1.0000 | ENEMA | RECTAL | Status: DC | PRN

## 2014-12-09 MED ORDER — ACETAMINOPHEN 325 MG PO TABS: 650.0000 mg | ORAL_TABLET | ORAL | Status: DC | PRN

## 2014-12-09 MED ORDER — OXYTOCIN 40 UNITS IN LACTATED RINGERS INFUSION - SIMPLE MED: 62.5000 mL/h | INTRAVENOUS | Status: DC

## 2014-12-09 MED ORDER — CITRIC ACID-SODIUM CITRATE 334-500 MG/5ML PO SOLN
30.0000 mL | ORAL | Status: DC | PRN
  Administered 2014-12-09: 30 mL via ORAL
  Filled 2014-12-09: qty 15

## 2014-12-09 MED ORDER — BUTORPHANOL TARTRATE 1 MG/ML IJ SOLN
1.0000 mg | Freq: Every day | INTRAMUSCULAR | Status: DC | PRN
Start: 2014-12-09 — End: 2014-12-10
  Administered 2014-12-09: 1 mg via INTRAVENOUS
  Filled 2014-12-09: qty 1

## 2014-12-09 MED ORDER — LACTATED RINGERS IV SOLN: 500.0000 mL | INTRAVENOUS | Status: DC | PRN

## 2014-12-09 NOTE — Anesthesia Procedure Notes (Signed)
Epidural Patient location during procedure: OB  Staffing Anesthesiologist: Rosio Weiss, CHRIS Performed by: anesthesiologist   Preanesthetic Checklist Completed: patient identified, surgical consent, pre-op evaluation, timeout performed, IV checked, risks and benefits discussed and monitors and equipment checked  Epidural Patient position: sitting Prep: site prepped and draped and DuraPrep Patient monitoring: heart rate, cardiac monitor, continuous pulse ox and blood pressure Approach: midline Location: L3-L4 Injection technique: LOR saline  Needle:  Needle type: Tuohy  Needle gauge: 17 G Needle length: 9 cm Needle insertion depth: 6 cm Catheter type: closed end flexible Catheter size: 19 Gauge Catheter at skin depth: 13 cm Test dose: negative and 2% lidocaine with Epi 1:200 K  Assessment Events: blood not aspirated, injection not painful, no injection resistance, negative IV test and no paresthesia  Additional Notes H+P and labs checked, risks and benefits discussed with the patient, consent obtained, procedure tolerated well and without complications.  Reason for block:procedure for pain   

## 2014-12-09 NOTE — Anesthesia Preprocedure Evaluation (Signed)
Anesthesia Evaluation  Patient identified by MRN, date of birth, ID band Patient awake    Reviewed: Allergy & Precautions, NPO status , Patient's Chart, lab work & pertinent test results  History of Anesthesia Complications Negative for: history of anesthetic complications  Airway Mallampati: II  TM Distance: >3 FB Neck ROM: Full    Dental  (+) Teeth Intact   Pulmonary neg pulmonary ROS,  breath sounds clear to auscultation        Cardiovascular negative cardio ROS  Rhythm:Regular     Neuro/Psych negative neurological ROS  negative psych ROS   GI/Hepatic negative GI ROS, Neg liver ROS,   Endo/Other  negative endocrine ROS  Renal/GU negative Renal ROS     Musculoskeletal   Abdominal   Peds  Hematology  (+) anemia ,   Anesthesia Other Findings   Reproductive/Obstetrics (+) Pregnancy                             Anesthesia Physical Anesthesia Plan  ASA: II  Anesthesia Plan: Epidural   Post-op Pain Management:    Induction:   Airway Management Planned:   Additional Equipment:   Intra-op Plan:   Post-operative Plan:   Informed Consent: I have reviewed the patients History and Physical, chart, labs and discussed the procedure including the risks, benefits and alternatives for the proposed anesthesia with the patient or authorized representative who has indicated his/her understanding and acceptance.   Dental advisory given  Plan Discussed with: CRNA and Surgeon  Anesthesia Plan Comments:         Anesthesia Quick Evaluation

## 2014-12-09 NOTE — H&P (Signed)
This is Dr. Francoise Ceo dictating the history and physical on  Sherri Butler  she is a 39 year old gravida 4 para 340 weeks and 3 days EDC 69 positive GBS desires induction cervix is 1 cm 90% vertex -2-3 amniotomy performed fluids clear IUPC inserted and she is on low-dose Pitocin Past medical history was negative Past surgical history negative Social history denies smoke drink or alcohol use System review none contributory Physical exam well-developed female in early labor HEENT negative Lungs clear to P&A Heart regular rhythm no murmurs no gallops Breasts negative Abdomen term Pelvic as described above Extremities negative

## 2014-12-10 ENCOUNTER — Encounter (HOSPITAL_COMMUNITY): Payer: Self-pay

## 2014-12-10 LAB — CBC
HEMATOCRIT: 26.8 % — AB (ref 36.0–46.0)
HEMOGLOBIN: 8.8 g/dL — AB (ref 12.0–15.0)
MCH: 25.1 pg — AB (ref 26.0–34.0)
MCHC: 32.8 g/dL (ref 30.0–36.0)
MCV: 76.6 fL — ABNORMAL LOW (ref 78.0–100.0)
Platelets: 222 10*3/uL (ref 150–400)
RBC: 3.5 MIL/uL — ABNORMAL LOW (ref 3.87–5.11)
RDW: 14.1 % (ref 11.5–15.5)
WBC: 13.3 10*3/uL — ABNORMAL HIGH (ref 4.0–10.5)

## 2014-12-10 LAB — RPR: RPR Ser Ql: NONREACTIVE

## 2014-12-10 MED ORDER — OXYCODONE-ACETAMINOPHEN 5-325 MG PO TABS: 1.0000 | ORAL_TABLET | ORAL | Status: DC | PRN

## 2014-12-10 MED ORDER — BENZOCAINE-MENTHOL 20-0.5 % EX AERO: 1.0000 "application " | INHALATION_SPRAY | CUTANEOUS | Status: DC | PRN

## 2014-12-10 MED ORDER — DIBUCAINE 1 % RE OINT: 1.0000 "application " | TOPICAL_OINTMENT | RECTAL | Status: DC | PRN

## 2014-12-10 MED ORDER — ONDANSETRON HCL 4 MG/2ML IJ SOLN: 4.0000 mg | INTRAMUSCULAR | Status: DC | PRN

## 2014-12-10 MED ORDER — WITCH HAZEL-GLYCERIN EX PADS: 1.0000 "application " | MEDICATED_PAD | CUTANEOUS | Status: DC | PRN

## 2014-12-10 MED ORDER — SENNOSIDES-DOCUSATE SODIUM 8.6-50 MG PO TABS
2.0000 | ORAL_TABLET | ORAL | Status: DC
  Administered 2014-12-10 – 2014-12-11 (×2): 2 via ORAL
  Filled 2014-12-10: qty 2

## 2014-12-10 MED ORDER — OXYCODONE-ACETAMINOPHEN 5-325 MG PO TABS: 2.0000 | ORAL_TABLET | ORAL | Status: DC | PRN

## 2014-12-10 MED ORDER — IBUPROFEN 600 MG PO TABS
600.0000 mg | ORAL_TABLET | Freq: Four times a day (QID) | ORAL | Status: DC
  Administered 2014-12-10 – 2014-12-11 (×6): 600 mg via ORAL
  Filled 2014-12-10 (×5): qty 1

## 2014-12-10 MED ORDER — ZOLPIDEM TARTRATE 5 MG PO TABS: 5.0000 mg | ORAL_TABLET | Freq: Every evening | ORAL | Status: DC | PRN

## 2014-12-10 MED ORDER — ACETAMINOPHEN 325 MG PO TABS
650.0000 mg | ORAL_TABLET | ORAL | Status: DC | PRN
  Administered 2014-12-10: 650 mg via ORAL
  Filled 2014-12-10: qty 2

## 2014-12-10 MED ORDER — FERROUS SULFATE 325 (65 FE) MG PO TABS
325.0000 mg | ORAL_TABLET | Freq: Two times a day (BID) | ORAL | Status: DC
  Administered 2014-12-10 – 2014-12-11 (×3): 325 mg via ORAL
  Filled 2014-12-10 (×3): qty 1

## 2014-12-10 MED ORDER — LANOLIN HYDROUS EX OINT: TOPICAL_OINTMENT | CUTANEOUS | Status: DC | PRN

## 2014-12-10 MED ORDER — DIPHENHYDRAMINE HCL 25 MG PO CAPS: 25.0000 mg | ORAL_CAPSULE | Freq: Four times a day (QID) | ORAL | Status: DC | PRN

## 2014-12-10 MED ORDER — PRENATAL MULTIVITAMIN CH
1.0000 | ORAL_TABLET | Freq: Every day | ORAL | Status: DC
  Administered 2014-12-10: 1 via ORAL
  Filled 2014-12-10: qty 1

## 2014-12-10 MED ORDER — SIMETHICONE 80 MG PO CHEW: 80.0000 mg | CHEWABLE_TABLET | ORAL | Status: DC | PRN

## 2014-12-10 MED ORDER — ONDANSETRON HCL 4 MG PO TABS: 4.0000 mg | ORAL_TABLET | ORAL | Status: DC | PRN

## 2014-12-10 MED ORDER — TETANUS-DIPHTH-ACELL PERTUSSIS 5-2.5-18.5 LF-MCG/0.5 IM SUSP: 0.5000 mL | Freq: Once | INTRAMUSCULAR | Status: DC

## 2014-12-10 NOTE — Progress Notes (Signed)
Patient ID: Sherri Butler, female   DOB: [birth year], 39 y.o.   MRN: [id number] Postpartum day one Blood pressure 116/71 Respiration 18 Pulse 75 Patient has no complaints Fundus firm Lochia moderate Legs negative doing well

## 2014-12-10 NOTE — Lactation Note (Signed)
This note was copied from the chart of Sherri Navya Spranger. Lactation Consultation Note Experience BF mom for 3 months each. Did breast and bottle feeding. Mom would have to go back to work so she bottle fed. Only BF on Lt. Breast d/t Rt. Nipple flat and unable to latch. Rt. Breast feels edematous, reverse pressure to areola to compress nipple. Unable to compress nipple to Rt. Hand expression done w/no colostrum noted. Fitted w/#16 NS. Mom taught how to apply & clean nipple shield. Shells given to wear in bra in am. To assist in everting nipples. Mom has long pendulum breast. Encouraged to elevate w/ cloth. Baby latched well to Lt. Breast. Nipple very compressible for deep latch. Hand pump given for stimulation and evert nipples. Speaks good english. Mom encouraged to feed baby 8-12 times/24 hours and with feeding cues. Mom encouraged to waken baby for feeds. Educated about newborn behavior. Encouraged to call for assistance if needed and to verify proper latch. Referred to Baby and Me Book in Breastfeeding section Pg. 22-23 for position options and Proper latch demonstration.[facility] brochure given w/resources, support groups and LC services. Patient Name: Sherri Butler [id number] Date: 12/10/2014 Reason for consult: Initial assessment   Maternal Data Has patient been taught Hand Expression?: Yes Does the patient have breastfeeding experience prior to this delivery?: Yes  Feeding Feeding Type: Formula Nipple Type: Slow - flow Length of feed: 15 min  LATCH Score/Interventions Latch: Grasps breast easily, tongue down, lips flanged, rhythmical sucking. Intervention(s): Adjust position;Assist with latch;Breast massage;Breast compression  Audible Swallowing: None Intervention(s): Skin to skin;Hand expression  Type of Nipple: Flat (Rt. nipple flat/Lt. nipple compresses well to evert for deep latch) Intervention(s): Shells;Hand pump;Reverse pressure  Comfort (Breast/Nipple): Soft / non-tender      Hold (Positioning): Assistance needed to correctly position infant at breast and maintain latch. Intervention(s): Support Pillows;Position options;Skin to skin;Breastfeeding basics reviewed  LATCH Score: 6  Lactation Tools Discussed/Used Tools: Shells;Nipple Dorris Carnes;Pump Nipple shield size: 16;20 Shell Type: Inverted Breast pump type: Manual Pump Review: Setup, frequency, and cleaning;Milk Storage Initiated by:: Peri Jefferson RN Date initiated:: 12/10/14   Consult Status Consult Status: Follow-up Date: 12/10/14 (in pm) Follow-up type: In-patient    Charyl Dancer 12/10/2014, 2:36 AM

## 2014-12-10 NOTE — Lactation Note (Signed)
This note was copied from the chart of Sherri Butler. Lactation Consultation Note  Patient Name: Sherri Aprilmarie Hardman [id number] Date: 12/10/2014    Attempted follow-up at 17 hours but picture lady was in room with mom. Infant has breastfed x2 (15-20 min) + formula fed x2 (10-15 ml) since birth 17 hours ago.  Voids-1; stools-3 since birth. Will follow-up with mom tomorrow.     Lendon Ka 12/10/2014, 5:33 PM

## 2014-12-10 NOTE — Anesthesia Postprocedure Evaluation (Signed)
Anesthesia Post Note  Patient: Sherri Butler  Procedure(s) Performed: * No procedures listed *  Anesthesia type: Epidural  Patient location: Mother/Baby  Post pain: Pain level controlled  Post assessment: Post-op Vital signs reviewed  Last Vitals:  Filed Vitals:   12/10/14 0539  BP: 116/71  Pulse: 75  Temp: 36.9 C  Resp: 18    Post vital signs: Reviewed  Level of consciousness:alert  Complications: No apparent anesthesia complications

## 2014-12-11 NOTE — Discharge Summary (Signed)
Obstetric Discharge Summary Reason for Admission: induction of labor Prenatal Procedures: none Intrapartum Procedures: spontaneous vaginal delivery Postpartum Procedures: none Complications-Operative and Postpartum: none HEMOGLOBIN  Date Value Ref Range Status  12/10/2014 8.8* 12.0 - 15.0 g/dL Final   HCT  Date Value Ref Range Status  12/10/2014 26.8* 36.0 - 46.0 % Final    Physical Exam:  General: alert Lochia: appropriate Uterine Fundus: firm Incision: healing well DVT Evaluation: No evidence of DVT seen on physical exam.  Discharge Diagnoses: Term Pregnancy-delivered  Discharge Information: Date: 12/11/2014 Activity: pelvic rest Diet: routine Medications: Percocet Condition: stable Instructions: refer to practice specific booklet Discharge to: home Follow-up Information    Follow up with Kathreen Cosier, MD.   Specialty:  Obstetrics and Gynecology   Contact information:   [address] [place] [address] [phone]       Newborn Data: Live born female  Birth Weight: 7 lb 4.8 oz (3311 g) APGAR: 9, 9  Home with mother.  Elizbeth Posa A 12/11/2014, 6:37 AM

## 2014-12-11 NOTE — Lactation Note (Signed)
This note was copied from the chart of Girl Naliah Chaviano. Lactation Consultation Note: Mother's breast are filling. She states that infant is feeding well but she feels slight pinch on initial latch. Observed that mothers nipples are intact. She was given comfort gels. Mother advised in treatment to prevent severe engorgement. Advised mother to continue to feeding infant with all feeding cues. Mother is aware of available LC services . She states she has good support.   Patient Name: Girl Mircale Cheah [id number] Date: 12/11/2014 Reason for consult: Follow-up assessment   Maternal Data    Feeding    LATCH Score/Interventions                      Lactation Tools Discussed/Used     Consult Status Consult Status: Complete    Michel Bickers 12/11/2014, 11:30 AM

## 2014-12-11 NOTE — Discharge Instructions (Signed)
Discharge instructions   You can wash your hair  Shower  Eat what you want  Drink what you want  See me in 6 weeks  Your ankles are going to swell more in the next 2 weeks than when pregnant  No sex for 6 weeks   Elmira Olkowski A, MD 12/11/2014

## 2014-12-11 NOTE — Progress Notes (Signed)
Patient ID: Sherri Butler, female   DOB: 1976-05-06, 39 y.o.   MRN: [id number] Postpartum day 2 Blood pressure 07/12/1969 respiration 17 pulse 66 temp 98 2 Patient feels fine she has no complaints Fundus firm Lochia moderate Legs negative doing well home today

## 2015-04-30 ENCOUNTER — Encounter (HOSPITAL_COMMUNITY): Payer: Self-pay | Admitting: Emergency Medicine

## 2015-04-30 ENCOUNTER — Emergency Department (INDEPENDENT_AMBULATORY_CARE_PROVIDER_SITE_OTHER)
Admission: EM | Admit: 2015-04-30 | Discharge: 2015-04-30 | Disposition: A | Discharge status: Home / Self Care | Payer: Self-pay | Source: Home / Self Care | Attending: Emergency Medicine | Admitting: Emergency Medicine

## 2015-04-30 DIAGNOSIS — L299 Pruritus, unspecified: Secondary | ICD-10-CM

## 2015-04-30 MED ORDER — HYDROXYZINE HCL 25 MG PO TABS: 25.0000 mg | ORAL_TABLET | Freq: Four times a day (QID) | ORAL | Status: DC | PRN

## 2015-04-30 MED ORDER — PREDNISONE 50 MG PO TABS: ORAL_TABLET | ORAL | Status: DC

## 2015-04-30 NOTE — ED Provider Notes (Signed)
HPI  SUBJECTIVE:  Sherri Butler is a 39 y.o. female who presents with diffuse, generalized pruritus for the past 4 days. She states it is present all day, but is worse in the evening. His been better with taking Benadryl, scratching it, scrubbing in hot showers, no aggravating factors. No nausea, vomiting, fevers, bodyaches. No new lotions, soaps, detergents, medications, foods, beverages. No rash. No angioedema, shortness of breath, wheezing. No contacts with similar symptoms. No pets in the home. No sensation being bitten at night, no blood in the bedclothes in the morning. She uses Vaseline as a moisturizer. Past medical history negative for eczema, atopy, asthma, allergies, diabetes, hypertension. LMP now.    Past Medical History  Diagnosis Date  . Medical history non-contributory     Past Surgical History  Procedure Laterality Date  . No past surgeries      No family history on file.  Social History  Substance Use Topics  . Smoking status: Never Smoker   . Smokeless tobacco: None  . Alcohol Use: No    No current facility-administered medications for this encounter.  Current outpatient prescriptions:  .  hydrOXYzine (ATARAX/VISTARIL) 25 MG tablet, Take 1 tablet (25 mg total) by mouth every 6 (six) hours as needed for itching., Disp: 20 tablet, Rfl: 0 .  predniSONE (DELTASONE) 50 MG tablet, 1 tablet po daily x 2 days, then 1/2 tablet once daily for 2 days, Disp: 5 tablet, Rfl: 0  No Known Allergies   ROS  As noted in HPI.   Physical Exam  BP 145/83 mmHg  Pulse 63  Temp(Src) 97.8 F (36.6 C) (Oral)  Resp 20  SpO2 100%  Constitutional: Well developed, well nourished, no acute distress Eyes:  EOMI, conjunctiva normal bilaterally HENT: Normocephalic, atraumatic,mucus membranes moist Respiratory: Normal inspiratory effort Cardiovascular: Normal rate GI: nondistended skin: No rash over her entire body. + Excoriations on chest, thighs,. No crusting, no blisters, no  vesicular rash, no burrows between fingers.  Musculoskeletal: no deformities Neurologic: Alert & oriented x 3, no focal neuro deficits Psychiatric: Speech and behavior appropriate   ED Course   Medications - No data to display  No orders of the defined types were placed in this encounter.    No results found for this or any previous visit (from the past 24 hour(s)). No results found.  ED Clinical Impression  Itching   ED Assessment/Plan  Pruritus from an unidentifiable source - does not appear to be scabies, fleas, bedbugs, urticaria, infection. Will start on Atarax, short course of prednisone, have patient switch to lukewarm showers, try Eucerin/Aquaphor. Follow up with primary care as needed return here if gets worse, to the ER for any anaphylactic type symptoms. Patient  agrees with plan.  *This clinic note was created using Dragon dictation software. Therefore, there may be occasional mistakes despite careful proofreading.  ?   Domenick GongAshley Kyisha Fowle, MD 04/30/15 [phone]

## 2015-04-30 NOTE — ED Notes (Signed)
Pt reports itching onset Friday No visible rash present; denies dyspnea, SOB Denies new foods/meds/hgyiene products A&O x4... No acute distress.

## 2015-04-30 NOTE — Discharge Instructions (Signed)
Try some Eucerin or Aquaphor. Lukewarm showers. Try Claritin or Zyrtec if the Atarax does not help.

## 2016-07-28 ENCOUNTER — Encounter: Payer: Self-pay | Admitting: Internal Medicine

## 2016-07-28 ENCOUNTER — Ambulatory Visit (INDEPENDENT_AMBULATORY_CARE_PROVIDER_SITE_OTHER): Payer: Self-pay | Admitting: Internal Medicine

## 2016-07-28 VITALS — BP 122/82 | HR 72 | Resp 18 | Ht 63.0 in | Wt 206.0 lb

## 2016-07-28 DIAGNOSIS — Z6836 Body mass index (BMI) 36.0-36.9, adult: Secondary | ICD-10-CM

## 2016-07-28 DIAGNOSIS — M67432 Ganglion, left wrist: Secondary | ICD-10-CM

## 2016-07-28 DIAGNOSIS — E669 Obesity, unspecified: Secondary | ICD-10-CM

## 2016-07-28 DIAGNOSIS — K0889 Other specified disorders of teeth and supporting structures: Secondary | ICD-10-CM

## 2016-07-28 NOTE — Progress Notes (Signed)
   Subjective:    Patient ID: Sherri Butler, female    DOB: 09/13/1975, 41 y.o.   MRN: [id number]  HPI Here to establish  1.  Dental pain:  Cannot chew on left side due to pain.  2.  Obesity:  Diet high in white rice, chicken, tomato sauce with potatos and carrots.   Sometimes eats breakfast:  Pancake, eggs, milk. Rarely has a fish sandwich at work.  3.  During exam, shows me her dorsal radial left wrist with small area of swelling that can enlarge and get smaller.  Has had for some time.  Does not really cause much discomfort.  Current Meds  Medication Sig  . levonorgestrel (MIRENA) 20 MCG/24HR IUD 1 each by Intrauterine route once.   No Known Allergies   Past Medical History:  Diagnosis Date  . Medical history non-contributory    Past Surgical History:  Procedure Laterality Date  . NO PAST SURGERIES     No family history on file.  Not aware of any health issues in family members.  Social History   Social History  . Marital status: Media plannerDomestic Partner    Spouse name: Emmie Niemannssa  . Number of children: 4  . Years of education: finished high school in [place]   Occupational History  . Food line, etc. [employer]   Social History Main Topics  . Smoking status: Never Smoker  . Smokeless tobacco: Never Used  . Alcohol use No  . Drug use: No  . Sexual activity: Yes    Birth control/ protection: IUD   Other Topics Concern  . Not on file   Social History Narrative   Originally from [place]   [place]   Lives at home with her boyfriend, who is father of youngest child, and her 4 children   Father of other children lives in [place]--does not provide any support.     Review of Systems     Objective:   Physical Exam NAD, obese HEENT: Cavity of left molar.  No surrounding gingival swelling or tenderness.  Throat without injection.  TMs pearly gray Neck:  Supple, No adenopathy, no thyromegaly Chest:  CTA CV:  RRR with normal S1 and S2, No S3, S4 or murmur, radial  pulses normal and equal Left wrist with 1 cm slightly compressible swelling over dorsal radial aspect.  Minimally tender with palpation.  Full ROM       Assessment & Plan:  1.  Dental decay/pain:  Ibuprofen prn.  Dental referral.  Discussed new referrals currently on hold, but would hold a referral for when this opens back up.  2.  Obesity:  Long discussion of improving lifestyle with diet and gradually increasing physical activity.  3.  Ganglion cyst, left wrist:  Follow for now.  To call if this should become more symptomatic.  Follow up in 3 months for CPE without pap as sounds like she has had in last 2 years.

## 2016-07-28 NOTE — Patient Instructions (Signed)
Drink a glass of water before every meal Drink 6-8 glasses of water daily Eat three meals daily Eat a protein and healthy fat with every meal (eggs,fish, chicken, turkey and limit red meats) Eat 5 servings of vegetables daily, mix the colors Eat 2 servings of fruit daily with skin, if skin is edible Use smaller plates Put food/utensils down as you chew and swallow each bite Eat at a table with friends/family at least once daily, no TV Do not eat in front of the TV  Can google "advance directives, [place]"  And bring up form from Secretary of State. Print and fill out Or can go to "5 wishes"  Which is also in Spanish and fill out--this costs $5--perhaps easier to use. Designate a Medical Power of Attorney to speak for you if you are unable to speak for yourself when ill or injured  

## 2016-10-26 ENCOUNTER — Encounter: Payer: Self-pay | Admitting: Internal Medicine

## 2016-10-26 ENCOUNTER — Ambulatory Visit (INDEPENDENT_AMBULATORY_CARE_PROVIDER_SITE_OTHER): Payer: Self-pay | Admitting: Internal Medicine

## 2016-10-26 VITALS — BP 138/84 | HR 79 | Resp 17 | Ht 63.0 in | Wt 204.0 lb

## 2016-10-26 DIAGNOSIS — N6310 Unspecified lump in the right breast, unspecified quadrant: Secondary | ICD-10-CM

## 2016-10-26 DIAGNOSIS — K029 Dental caries, unspecified: Secondary | ICD-10-CM

## 2016-10-26 DIAGNOSIS — Z0000 Encounter for general adult medical examination without abnormal findings: Secondary | ICD-10-CM

## 2016-10-26 NOTE — Patient Instructions (Signed)
Can google "advance directives, [place]"  And bring up form from Secretary of State. Print and fill out Or can go to "5 wishes"  Which is also in Spanish and fill out--this costs $5--perhaps easier to use. Designate a Medical Power of Attorney to speak for you if you are unable to speak for yourself when ill or injured  

## 2016-10-26 NOTE — Progress Notes (Signed)
Subjective:    Patient ID: Sherri Butler, female    DOB: 29-Apr-1976, 41 y.o.   MRN: [id number]  HPI   CPE without pap  1.  Pap:  Last was 05/15/2014 and normal.    Always normal.  No family history.  2.  Mammogram:  Never.  No family history.  3.  Osteoprevention: 2 servings of dairy daily.  4.  Guaiac Cards:  Never.    5.  Colonoscopy:  Never.  No family history  6.  Immunizations:  Not sure what immunizations she has had.  States she was given immunizations with her pregnancies.  Unable to find in Epic chart.  7.  Glucose/cholesterol:  Never had a problem with sugar with pregnancy.  She does not think a cholesterol has ever been checked.  Current Meds  Medication Sig  . levonorgestrel (MIRENA) 20 MCG/24HR IUD 1 each by Intrauterine route once.   No Known Allergies   Past Medical History:  Diagnosis Date  . Pregnancy induced hypertension 2016    Past Surgical History:  Procedure Laterality Date  . NO PAST SURGERIES      History reviewed. No pertinent family history.   Social History   Social History  . Marital status: Media planner    Spouse name: Emmie Niemann  . Number of children: 4  . Years of education: finished high school in [place]   Occupational History  . Food line, etc. [employer]   Social History Main Topics  . Smoking status: Never Smoker  . Smokeless tobacco: Never Used  . Alcohol use No  . Drug use: No  . Sexual activity: Yes    Birth control/ protection: IUD   Other Topics Concern  . Not on file   Social History Narrative   Originally from [place]   [place] to [place] In [place]   Lives at home with her boyfriend, who is father of youngest child, and her 4 children   Father of other children lives in [place] not provide any support.      Review of Systems  Constitutional: Negative for activity change and fatigue.  HENT: Positive for congestion, dental problem (hole in tooth causing pain) and postnasal drip. Negative for ear pain, hearing loss  and sore throat.   Eyes: Negative for itching and visual disturbance.  Respiratory: Positive for cough. Negative for shortness of breath.   Cardiovascular: Negative for chest pain, palpitations and leg swelling.  Gastrointestinal: Negative for abdominal pain, blood in stool (no melena), constipation, diarrhea and nausea.  Genitourinary: Negative for dysuria, frequency, genital sores and vaginal discharge.  Neurological: Negative for dizziness, weakness, numbness and headaches.  Hematological: Negative for adenopathy. Does not bruise/bleed easily.  Psychiatric/Behavioral: Negative for dysphoric mood and suicidal ideas. The patient is not nervous/anxious.        Objective:   Physical Exam  Constitutional: She is oriented to person, place, and time. She appears well-developed and well-nourished.  HENT:  Head: Normocephalic and atraumatic.  Right Ear: Hearing, tympanic membrane, external ear and ear canal normal.  Left Ear: Hearing, tympanic membrane, external ear and ear canal normal.  Nose: Mucosal edema and rhinorrhea present.  Mouth/Throat: Uvula is midline, oropharynx is clear and moist and mucous membranes are normal.  Eyes: Conjunctivae and EOM are normal. Pupils are equal, round, and reactive to light.  Discs sharp bilaterally  Neck: Normal range of motion and full passive range of motion without pain. Neck supple. No thyroid mass and no thyromegaly present.  Cardiovascular: Normal rate,  regular rhythm, S1 normal and S2 normal.  Exam reveals no S3, no S4 and no friction rub.   No murmur heard. No carotid bruits.  Carotid, radial, femoral, DP and PT pulses normal and equal.   Pulmonary/Chest: Effort normal and breath sounds normal. Right breast exhibits mass and skin change (Thickening of area outer edge of areola at about 2 O'Clock with "peel of orange"  appearance and with 2 cm mass below). Right breast exhibits no inverted nipple, no nipple discharge and no tenderness. Left breast  exhibits no inverted nipple, no mass, no nipple discharge, no skin change and no tenderness.  Abdominal: Soft. Bowel sounds are normal. She exhibits no mass. There is no hepatosplenomegaly. There is no tenderness. No hernia. Hernia confirmed negative in the right inguinal area and confirmed negative in the left inguinal area.  Genitourinary: Vagina normal and uterus normal. Cervix exhibits no motion tenderness and no discharge. Right adnexum displays no mass and no tenderness. Left adnexum displays no mass and no tenderness.  Musculoskeletal: Normal range of motion.  Lymphadenopathy:       Head (right side): No submental and no submandibular adenopathy present.       Head (left side): No submental and no submandibular adenopathy present.    She has no cervical adenopathy.    She has no axillary adenopathy.       Right: No inguinal and no supraclavicular adenopathy present.       Left: No inguinal and no supraclavicular adenopathy present.  Neurological: She is alert and oriented to person, place, and time. She has normal strength and normal reflexes. No cranial nerve deficit or sensory deficit. Coordination and gait normal.  Skin: Skin is warm. No rash noted.  Psychiatric: She has a normal mood and affect. Her speech is normal and behavior is normal. Judgment and thought content normal. Cognition and memory are normal.          Assessment & Plan:  1.  CPE with no pap Due for pap next year. Check on immunizations with [facility] as unable to find documentation in her chart Return for fasting labs in next week:  FLP, CMP, CBC.   To work on lifestyle changes for weight loss. Encouraged 3 servings of dairy daily and weight bearing physical activity  2.  Right Breast mass with overlying skin changes:  Diagnostic mammogram with [facility].  3.  URI:  Symptomatic care.  4.  Dental Cavity: dental referral.

## 2016-11-02 ENCOUNTER — Other Ambulatory Visit (INDEPENDENT_AMBULATORY_CARE_PROVIDER_SITE_OTHER): Payer: Self-pay

## 2016-11-02 DIAGNOSIS — Z0000 Encounter for general adult medical examination without abnormal findings: Secondary | ICD-10-CM

## 2016-11-03 LAB — COMPREHENSIVE METABOLIC PANEL
A/G RATIO: 1.4 (ref 1.2–2.2)
ALBUMIN: 4 g/dL (ref 3.5–5.5)
ALK PHOS: 60 IU/L (ref 39–117)
ALT: 13 IU/L (ref 0–32)
AST: 18 IU/L (ref 0–40)
BILIRUBIN TOTAL: 0.4 mg/dL (ref 0.0–1.2)
BUN/Creatinine Ratio: 16 (ref 9–23)
BUN: 10 mg/dL (ref 6–20)
CALCIUM: 8.9 mg/dL (ref 8.7–10.2)
CHLORIDE: 101 mmol/L (ref 96–106)
CO2: 23 mmol/L (ref 18–29)
Creatinine, Ser: 0.63 mg/dL (ref 0.57–1.00)
GFR calc Af Amer: 132 mL/min/{1.73_m2} (ref >59)
GFR calc non Af Amer: 114 mL/min/{1.73_m2} (ref >59)
GLUCOSE: 85 mg/dL (ref 65–99)
Globulin, Total: 2.8 g/dL (ref 1.5–4.5)
POTASSIUM: 4.3 mmol/L (ref 3.5–5.2)
Sodium: 140 mmol/L (ref 134–144)
TOTAL PROTEIN: 6.8 g/dL (ref 6.0–8.5)

## 2016-11-03 LAB — CBC WITH DIFFERENTIAL/PLATELET
Basophils Absolute: 0 10*3/uL (ref 0.0–0.2)
Basos: 0 %
EOS (ABSOLUTE): 0.1 10*3/uL (ref 0.0–0.4)
Eos: 2 %
Hematocrit: 38.3 % (ref 34.0–46.6)
Hemoglobin: 12.8 g/dL (ref 11.1–15.9)
IMMATURE GRANULOCYTES: 0 %
Immature Grans (Abs): 0 10*3/uL (ref 0.0–0.1)
Lymphocytes Absolute: 2.2 10*3/uL (ref 0.7–3.1)
Lymphs: 29 %
MCH: 27.4 pg (ref 26.6–33.0)
MCHC: 33.4 g/dL (ref 31.5–35.7)
MCV: 82 fL (ref 79–97)
MONOCYTES: 8 %
Monocytes Absolute: 0.6 10*3/uL (ref 0.1–0.9)
NEUTROS PCT: 61 %
Neutrophils Absolute: 4.6 10*3/uL (ref 1.4–7.0)
PLATELETS: 290 10*3/uL (ref 150–379)
RBC: 4.68 x10E6/uL (ref 3.77–5.28)
RDW: 14.2 % (ref 12.3–15.4)
WBC: 7.6 10*3/uL (ref 3.4–10.8)

## 2016-11-03 LAB — LIPID PANEL W/O CHOL/HDL RATIO
Cholesterol, Total: 149 mg/dL (ref 100–199)
HDL: 44 mg/dL (ref >39)
LDL Calculated: 94 mg/dL (ref 0–99)
Triglycerides: 54 mg/dL (ref 0–149)
VLDL Cholesterol Cal: 11 mg/dL (ref 5–40)

## 2016-12-29 ENCOUNTER — Other Ambulatory Visit (HOSPITAL_COMMUNITY): Payer: Self-pay | Admitting: *Deleted

## 2016-12-29 DIAGNOSIS — N6310 Unspecified lump in the right breast, unspecified quadrant: Secondary | ICD-10-CM

## 2017-01-14 ENCOUNTER — Encounter (HOSPITAL_COMMUNITY): Payer: Self-pay

## 2017-01-14 ENCOUNTER — Ambulatory Visit
Admission: RE | Admit: 2017-01-14 | Discharge: 2017-01-14 | Disposition: A | Discharge status: Home / Self Care | Payer: No Typology Code available for payment source | Source: Ambulatory Visit | Attending: Obstetrics and Gynecology | Admitting: Obstetrics and Gynecology

## 2017-01-14 ENCOUNTER — Ambulatory Visit
Admission: RE | Admit: 2017-01-14 | Discharge: 2017-01-14 | Disposition: A | Discharge status: Home / Self Care | Payer: Medicaid Other | Source: Ambulatory Visit | Attending: Obstetrics and Gynecology | Admitting: Obstetrics and Gynecology

## 2017-01-14 ENCOUNTER — Ambulatory Visit (HOSPITAL_COMMUNITY)
Admission: RE | Admit: 2017-01-14 | Discharge: 2017-01-14 | Disposition: A | Discharge status: Home / Self Care | Payer: Self-pay | Source: Ambulatory Visit | Attending: Obstetrics and Gynecology | Admitting: Obstetrics and Gynecology

## 2017-01-14 VITALS — BP 142/100 | Temp 98.5°F | Ht 63.0 in | Wt 203.6 lb

## 2017-01-14 DIAGNOSIS — N6311 Unspecified lump in the right breast, upper outer quadrant: Secondary | ICD-10-CM

## 2017-01-14 DIAGNOSIS — Z1239 Encounter for other screening for malignant neoplasm of breast: Secondary | ICD-10-CM

## 2017-01-14 DIAGNOSIS — N6310 Unspecified lump in the right breast, unspecified quadrant: Secondary | ICD-10-CM

## 2017-01-14 NOTE — Patient Instructions (Signed)
Explained breast self awareness with Mahealani Abadi. Patient did not need a Pap smear today due to last Pap smear was 05/15/2014. Let her know [facility] will cover Pap smears every 3 years unless has a history of abnormal Pap smears. Reminded patient that her next Pap smear is due in November and that she can schedule with [facility]. [facility] and possible right breast ultrasound. Appointment scheduled for Thursday, January 14, 2017 at 0920. Kyrstin Hartwell verbalized understanding.  Marquavius Scaife, Kathaleen Maserhristine Poll, RN 8:32 AM

## 2017-01-14 NOTE — Progress Notes (Signed)
Complaints of right breast lump x 2 years with no change in size.  Pap Smear: Pap smear not completed today. Last Pap smear was 05/15/2014 at Dr. Elsie StainMarshall's office and normal. Per patient has no history of an abnormal Pap smear. Last Pap smear result is in EPIC.  Physical exam: Breasts Breasts symmetrical. No skin abnormalities bilateral breasts. No nipple retraction bilateral breasts. No nipple discharge bilateral breasts. No lymphadenopathy. No lumps palpated left breast. Palpated a lump versus thickened skin within the right breast at 10 o'clock 11 cm from the nipple. No complaints of pain or tenderness on exam. [facility] and possible right breast ultrasound. Appointment scheduled for Thursday, January 14, 2017 at 0920.        Pelvic/Bimanual No Pap smear completed today since last Pap smear was 05/15/2014. Pap smear not indicated per [facility] guidelines.   Smoking History: Patient has never smoked.  Patient Navigation: Patient education provided. [facility].

## 2017-01-15 ENCOUNTER — Encounter (HOSPITAL_COMMUNITY): Payer: Self-pay | Admitting: *Deleted

## 2017-02-10 ENCOUNTER — Ambulatory Visit: Payer: Self-pay | Admitting: Internal Medicine

## 2018-06-29 DIAGNOSIS — E669 Obesity, unspecified: Secondary | ICD-10-CM

## 2018-06-29 HISTORY — DX: Obesity, unspecified: E66.9

## 2018-08-02 ENCOUNTER — Encounter: Payer: Self-pay | Admitting: Internal Medicine

## 2018-08-02 ENCOUNTER — Ambulatory Visit: Payer: Self-pay | Admitting: Internal Medicine

## 2018-08-02 VITALS — BP 124/80 | HR 76 | Resp 12 | Ht 63.0 in | Wt 204.0 lb

## 2018-08-02 DIAGNOSIS — E669 Obesity, unspecified: Secondary | ICD-10-CM

## 2018-08-02 DIAGNOSIS — Z6836 Body mass index (BMI) 36.0-36.9, adult: Secondary | ICD-10-CM

## 2018-08-02 NOTE — Patient Instructions (Addendum)
Drink a glass of water before every meal Drink 6-8 glasses of water daily Eat three meals daily Eat a protein and healthy fat with every meal (eggs,fish, chicken, [place] and limit red meats) Eat 5 servings of vegetables daily, mix the colors Eat 2 servings of fruit daily with skin, if skin is edible Use smaller plates Put food/utensils down as you chew and swallow each bite Eat at a table with friends/family at least once daily, no TV Do not eat in front of the TV  Recent studies show that people who consume all of their calories in a 12 hour period lose weight more efficiently.  For example, if you eat your first meal at 7:00 a.m., your last meal of the day should be completed by 7:00 p.m  Get a calendar and write down your goals.Marland Kitchen

## 2018-08-02 NOTE — Progress Notes (Signed)
     Subjective:    Patient ID: Sherri Butler, female   DOB: [birth year], 43 y.o.   MRN: [id number]   HPI   1.  Obesity:  Feels she has tried a number of ways to lose weight, but does not seem to help.    4 children at home, ages   82 1/2, 63,14, 77.  Separated from father, who is supportive.    Up at 6:30 a.m. Gets herself and everyone ready for school. 7 a.m.  Drinks water 8:30 a.m.:  Apple juice or hot chocolate at [employer] where she works. 3:30 -4 p.m.:  First meal:  White rice with tomato sauce, Lamb.  Water. Nothing else to eat or drink   Physical activity:   Not much.  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    Current Meds  Medication Sig  . levonorgestrel (MIRENA) 20 MCG/24HR IUD 1 each by Intrauterine route once.   No Known Allergies   Review of Systems    Objective:   BP 124/80 (BP Location: Left Arm, Patient Position: Sitting, Cuff Size: Normal)   Pulse 76   Resp 12   Ht 5\' 3"  (1.6 m)   Wt 204 lb (92.5 kg)   LMP 08/01/2018 (Exact Date)   BMI 36.14 kg/m   Physical Exam NAD HEENT:  PERRL, EOMI Neck:  Supple, No adenopathy, no thyromegaly Chest:  CTA CV:  RRR without murmur or rub.  Radial pulses normal and equal Abd:  S, NT, No HSM or mass, + BS LE:  No edema.  Assessment & Plan   Obesity:  Discussed obtaining calendar and writing down goals to start each week that are attainable, both with diet and finding ways to be physically active. Discussed walking while her children are being tutored. Diet discussed at length and that all in family need to be working on a healthy diet and eating habits together.  To follow up in 3-4 months for CPE and check on how she is doing with above.

## 2018-11-07 ENCOUNTER — Encounter: Payer: Self-pay | Admitting: Internal Medicine

## 2019-01-03 ENCOUNTER — Encounter: Payer: Self-pay | Admitting: Internal Medicine

## 2019-02-07 ENCOUNTER — Ambulatory Visit: Payer: Self-pay | Admitting: Internal Medicine

## 2019-02-07 ENCOUNTER — Other Ambulatory Visit: Payer: Self-pay

## 2019-02-07 ENCOUNTER — Encounter: Payer: Self-pay | Admitting: Internal Medicine

## 2019-02-07 VITALS — BP 132/82 | HR 68 | Temp 98.1°F | Resp 12 | Ht 63.0 in | Wt 207.0 lb

## 2019-02-07 DIAGNOSIS — Z6836 Body mass index (BMI) 36.0-36.9, adult: Secondary | ICD-10-CM

## 2019-02-07 DIAGNOSIS — Z0000 Encounter for general adult medical examination without abnormal findings: Secondary | ICD-10-CM

## 2019-02-07 DIAGNOSIS — Z124 Encounter for screening for malignant neoplasm of cervix: Secondary | ICD-10-CM

## 2019-02-07 DIAGNOSIS — E669 Obesity, unspecified: Secondary | ICD-10-CM

## 2019-02-07 NOTE — Patient Instructions (Addendum)
Drink a glass of water before every meal Drink 6-8 glasses of water daily Eat three meals daily Eat a protein and healthy fat with every meal (eggs,fish, chicken, [place] and limit red meats) Eat 5 servings of vegetables daily, mix the colors Eat 2 servings of fruit daily with skin, if skin is edible Use smaller plates Put food/utensils down as you chew and swallow each bite Eat at a table with friends/family at least once daily, no TV Do not eat in front of the TV  Recent studies show that people who consume all of their calories in a 12 hour period lose weight more efficiently.  For example, if you eat your first meal at 7:00 a.m., your last meal of the day should be completed by 7:00 p.m.  Return guaiac cards in 2 weeks.  Do not perform the cards while you are having your period.

## 2019-02-07 NOTE — Progress Notes (Signed)
Subjective:    Patient ID: Sherri Butler, female   DOB: 1976-01-07, 43 y.o.   MRN: [id number]   HPI   CPE with pap  1.  Pap:  4 years ago.  Always normal  2.  Mammogram:  Had diagnostic bilateral mammogram and ultrasound of right breast 01/2018.  Normal.  No family history of breast cancer.  Discussed risks and elects not to do this year.  3.  Osteoprevention:  Drinks 1 cup 2% milk daily.  Does not like it.  Willing to try almond milk.  Walks 15 minutes 5 days weekly and willing to gradually increase that to 60 minutes daily.  4.  Guaiac Cards:  Never.  5.  Colonoscopy:  Never.  No family history of colon cancer  6.  Immunizations:   Immunization History  Administered Date(s) Administered  . Influenza,inj,Quad PF,6+ Mos 06/05/2016  . Tdap 06/29/2014     7.  Glucose/Cholesterol:  History of mildly elevated glucose in the past when pregnant, but not gestational DM.   Cholesterol was fine in 2018. Lipid Panel     Component Value Date/Time   CHOL 149 11/02/2016 0925   TRIG 54 11/02/2016 0925   HDL 44 11/02/2016 0925   LDLCALC 94 11/02/2016 0925     Current Meds  Medication Sig  . levonorgestrel (MIRENA) 20 MCG/24HR IUD 1 each by Intrauterine route once.   No Known Allergies   Past Medical History:  Diagnosis Date  . Obesity (BMI 35.0-39.9 without comorbidity) 2020  . Pregnancy induced hypertension 2016    Past Surgical History:  Procedure Laterality Date  . NO PAST SURGERIES      No family history on file.  Parents, siblings, children all alive and well.  Social History   Socioeconomic History  . Marital status: Media plannerDomestic Partner    Spouse name: Emmie Niemannssa  . Number of children: 4  . Years of education: finished high school in [place]  . Highest education level: Not on file  Occupational History  . Occupation: Print production plannerood line, Catering manageretc.    Employer: [employer]  Social Needs  . Financial resource strain: Not on file  . Food insecurity    Worry: Never true   Inability: Never true  . Transportation needs    Medical: No    Non-medical: No  Tobacco Use  . Smoking status: Never Smoker  . Smokeless tobacco: Never Used  Substance and Sexual Activity  . Alcohol use: No  . Drug use: No  . Sexual activity: Yes    Birth control/protection: I.U.D.  Lifestyle  . Physical activity    Days per week: 5 days    Minutes per session: 20 min  . Stress: Not on file  Relationships  . Social Musicianconnections    Talks on phone: Not on file    Gets together: Not on file    Attends religious service: Not on file    Active member of club or organization: Not on file    Attends meetings of clubs or organizations: Not on file    Relationship status: Not on file  . Intimate partner violence    Fear of current or ex partner: No    Emotionally abused: No    Physically abused: No    Forced sexual activity: No  Other Topics Concern  . Not on file  Social History Narrative   Originally from [place]   [place]   Lives at home with her boyfriend, who is father of  youngest child, and her 4 children   Father of other children lives in [place]--does not provide any support.       Review of Systems  Constitutional: Positive for fatigue (Working a lot and bunch of kids). Negative for unexpected weight change (Gaining weight).  HENT: Negative for ear pain, hearing loss and rhinorrhea.   Eyes: Negative for visual disturbance.  Respiratory: Negative for cough and shortness of breath.   Cardiovascular: Negative for chest pain, palpitations and leg swelling.  Gastrointestinal: Negative for abdominal pain, blood in stool, constipation and diarrhea.  Genitourinary: Negative for dysuria and vaginal discharge.  Musculoskeletal: Negative for arthralgias.  Skin: Negative for rash.  Neurological: Negative for weakness and numbness.  Psychiatric/Behavioral: Negative for dysphoric mood. The patient is not nervous/anxious.       Objective:   BP 132/82 (BP  Location: Right Arm, Patient Position: Sitting, Cuff Size: Normal)   Pulse 68   Temp 98.1 F (36.7 C)   Resp 12   Ht 5\' 3"  (1.6 m)   Wt 207 lb (93.9 kg)   BMI 36.67 kg/m   Physical Exam  Constitutional: She is oriented to person, place, and time. She appears well-developed and well-nourished.  HENT:  Head: Normocephalic and atraumatic.  Right Ear: Hearing, tympanic membrane, external ear and ear canal normal.  Left Ear: Hearing, tympanic membrane, external ear and ear canal normal.  Nose: Nose normal.  Mouth/Throat: Uvula is midline, oropharynx is clear and moist and mucous membranes are normal.  Eyes: Pupils are equal, round, and reactive to light. Conjunctivae and EOM are normal.  Discs sharp bilaterally  Neck: Normal range of motion and full passive range of motion without pain. Neck supple. No thyromegaly present.  Cardiovascular: Normal rate, regular rhythm, S1 normal and S2 normal. Exam reveals no S3, no S4 and no friction rub.  No murmur heard. No carotid bruits.  Carotid, radial, femoral, DP and PT pulses normal and equal.   Pulmonary/Chest: Effort normal and breath sounds normal. Right breast exhibits no inverted nipple, no mass, no nipple discharge, no skin change and no tenderness. Left breast exhibits no inverted nipple, no mass, no nipple discharge, no skin change and no tenderness.  Abdominal: Soft. Bowel sounds are normal. She exhibits no mass. There is no hepatosplenomegaly. There is no abdominal tenderness. No hernia.  Genitourinary:    Genitourinary Comments: Normal external female genitalia.  No vaginal or cervical mucosal lesions.   Scant blood from cervical os.  IUD strings visualized from os. No uterine or adnexal mass or tenderness. Rectal:  No mass, heme negative light brown stool.     Musculoskeletal: Normal range of motion.  Lymphadenopathy:       Head (right side): No submental and no submandibular adenopathy present.       Head (left side): No submental  and no submandibular adenopathy present.    She has no cervical adenopathy.    She has no axillary adenopathy.       Right: No inguinal and no supraclavicular adenopathy present.       Left: No inguinal and no supraclavicular adenopathy present.  Neurological: She is alert and oriented to person, place, and time. She has normal strength and normal reflexes. No cranial nerve deficit or sensory deficit. Coordination and gait normal.  Skin: Skin is warm. No rash noted.  Psychiatric: She has a normal mood and affect. Her speech is normal and behavior is normal. Judgment and thought content normal. Cognition and memory are normal.  Assessment & Plan  1.  CPE with pap Elects to not have repeat mammogram this year. Guaiac Cards x 3 to return in 2 weeks. CBC, CMP, FLP Encouraged calling for free influenza vaccine clinic in September or October.  2.  Obesity:  Encouraged lifestyle changes with diet and physical activity.

## 2019-02-08 LAB — LIPID PANEL W/O CHOL/HDL RATIO
Cholesterol, Total: 180 mg/dL (ref 100–199)
HDL: 48 mg/dL (ref >39)
LDL Calculated: 122 mg/dL — ABNORMAL HIGH (ref 0–99)
Triglycerides: 48 mg/dL (ref 0–149)
VLDL Cholesterol Cal: 10 mg/dL (ref 5–40)

## 2019-02-08 LAB — COMPREHENSIVE METABOLIC PANEL
ALT: 18 IU/L (ref 0–32)
AST: 21 IU/L (ref 0–40)
Albumin/Globulin Ratio: 1.6 (ref 1.2–2.2)
Albumin: 4.2 g/dL (ref 3.8–4.8)
Alkaline Phosphatase: 60 IU/L (ref 39–117)
BUN/Creatinine Ratio: 15 (ref 9–23)
BUN: 10 mg/dL (ref 6–24)
Bilirubin Total: 0.4 mg/dL (ref 0.0–1.2)
CO2: 22 mmol/L (ref 20–29)
Calcium: 8.9 mg/dL (ref 8.7–10.2)
Chloride: 105 mmol/L (ref 96–106)
Creatinine, Ser: 0.67 mg/dL (ref 0.57–1.00)
GFR calc Af Amer: 127 mL/min/{1.73_m2} (ref >59)
GFR calc non Af Amer: 110 mL/min/{1.73_m2} (ref >59)
Globulin, Total: 2.6 g/dL (ref 1.5–4.5)
Glucose: 99 mg/dL (ref 65–99)
Potassium: 4.4 mmol/L (ref 3.5–5.2)
Sodium: 139 mmol/L (ref 134–144)
Total Protein: 6.8 g/dL (ref 6.0–8.5)

## 2019-02-08 LAB — CBC WITH DIFFERENTIAL/PLATELET

## 2019-02-08 LAB — CYTOLOGY - PAP

## 2019-05-10 ENCOUNTER — Ambulatory Visit: Payer: Self-pay | Admitting: Internal Medicine

## 2020-01-26 ENCOUNTER — Encounter: Payer: Self-pay | Admitting: Obstetrics and Gynecology

## 2020-01-26 ENCOUNTER — Other Ambulatory Visit: Payer: Self-pay | Admitting: Obstetrics and Gynecology

## 2020-01-26 DIAGNOSIS — T8332XA Displacement of intrauterine contraceptive device, initial encounter: Secondary | ICD-10-CM | POA: Insufficient documentation

## 2020-01-26 NOTE — Progress Notes (Signed)
GYN Note  Patient seen at [facility] today for MD consult for lost IUD. In brief, after both IUD strings avulsed at her annual visit on 7/26, pt set up for MD visit today. No strings seen and I was unable to remove.   I will set pt up for an ultrasound and then finalize OR plan after that. Pt amenable to plan.   Follow up with patient re: contraception plan as she is unsure at this time  Cornelia Copa MD Attending Center for [employer] (Faculty Practice) 01/26/2020 Time: [phone]

## 2020-01-30 ENCOUNTER — Telehealth: Payer: Self-pay | Admitting: General Practice

## 2020-01-30 NOTE — Telephone Encounter (Signed)
Scheduled u/s 8/5 @ 1pm. Called & informed patient. Patient verbalized understanding.

## 2020-01-30 NOTE — Telephone Encounter (Signed)
-----   Message from [place] Bing, MD sent at 01/26/2020  2:54 PM EDT ----- Regarding: patient needs transvag u/s for lost IUD # is [phone]. Pt speaks english. Order is in. thanks

## 2020-02-01 ENCOUNTER — Other Ambulatory Visit: Payer: Self-pay

## 2020-02-01 ENCOUNTER — Ambulatory Visit
Admission: RE | Admit: 2020-02-01 | Discharge: 2020-02-01 | Disposition: A | Discharge status: Home / Self Care | Payer: BC Managed Care – PPO | Source: Ambulatory Visit | Attending: Obstetrics and Gynecology | Admitting: Obstetrics and Gynecology

## 2020-02-01 DIAGNOSIS — T8332XA Displacement of intrauterine contraceptive device, initial encounter: Secondary | ICD-10-CM | POA: Diagnosis present

## 2020-02-05 ENCOUNTER — Telehealth: Payer: Self-pay | Admitting: Obstetrics and Gynecology

## 2020-02-05 ENCOUNTER — Other Ambulatory Visit: Payer: Self-pay | Admitting: Obstetrics and Gynecology

## 2020-02-05 NOTE — Telephone Encounter (Signed)
GYN Telephone Note Patient called and I d/w her re: IUD seen in the uterus. Patient wants one more child. D/w her that I could try again in the office but I'm not optimistic so patient is amenable to removal in the OR. Pt unsure of what she would like to do for [facility] after that. IUD placed five years ago so still okay for [facility] for one more year.   Will send request for scheduling of hysteroscopy, IUD removal.   Alvarado Bing, Montez Hageman MD Attending Center for [facility] (Faculty Practice) 02/05/2020 Time: 1114am

## 2020-02-14 ENCOUNTER — Other Ambulatory Visit: Payer: Self-pay

## 2020-02-14 ENCOUNTER — Encounter (HOSPITAL_BASED_OUTPATIENT_CLINIC_OR_DEPARTMENT_OTHER): Payer: Self-pay | Admitting: Obstetrics and Gynecology

## 2020-02-17 ENCOUNTER — Other Ambulatory Visit (HOSPITAL_COMMUNITY)
Admission: RE | Admit: 2020-02-17 | Discharge: 2020-02-17 | Disposition: A | Discharge status: Home / Self Care | Payer: BC Managed Care – PPO | Source: Ambulatory Visit | Attending: Obstetrics and Gynecology | Admitting: Obstetrics and Gynecology

## 2020-02-17 DIAGNOSIS — Z20822 Contact with and (suspected) exposure to covid-19: Secondary | ICD-10-CM | POA: Insufficient documentation

## 2020-02-17 DIAGNOSIS — Z01812 Encounter for preprocedural laboratory examination: Secondary | ICD-10-CM | POA: Diagnosis not present

## 2020-02-17 LAB — SARS CORONAVIRUS 2 (TAT 6-24 HRS): SARS Coronavirus 2: NEGATIVE

## 2020-02-21 ENCOUNTER — Encounter (HOSPITAL_BASED_OUTPATIENT_CLINIC_OR_DEPARTMENT_OTHER): Payer: Self-pay | Admitting: Obstetrics and Gynecology

## 2020-02-21 ENCOUNTER — Ambulatory Visit (HOSPITAL_BASED_OUTPATIENT_CLINIC_OR_DEPARTMENT_OTHER)
Admission: RE | Admit: 2020-02-21 | Discharge: 2020-02-21 | Disposition: A | Discharge status: Home / Self Care | Payer: BC Managed Care – PPO | Attending: Obstetrics and Gynecology | Admitting: Obstetrics and Gynecology

## 2020-02-21 ENCOUNTER — Encounter (HOSPITAL_BASED_OUTPATIENT_CLINIC_OR_DEPARTMENT_OTHER)
Admission: RE | Disposition: A | Discharge status: Home / Self Care | Payer: Self-pay | Source: Home / Self Care | Attending: Obstetrics and Gynecology

## 2020-02-21 ENCOUNTER — Other Ambulatory Visit: Payer: Self-pay

## 2020-02-21 ENCOUNTER — Ambulatory Visit (HOSPITAL_BASED_OUTPATIENT_CLINIC_OR_DEPARTMENT_OTHER): Payer: BC Managed Care – PPO | Admitting: Certified Registered Nurse Anesthetist

## 2020-02-21 DIAGNOSIS — Z6835 Body mass index (BMI) 35.0-35.9, adult: Secondary | ICD-10-CM | POA: Insufficient documentation

## 2020-02-21 DIAGNOSIS — Z9889 Other specified postprocedural states: Secondary | ICD-10-CM

## 2020-02-21 DIAGNOSIS — Z30432 Encounter for removal of intrauterine contraceptive device: Secondary | ICD-10-CM

## 2020-02-21 DIAGNOSIS — T8332XA Displacement of intrauterine contraceptive device, initial encounter: Secondary | ICD-10-CM

## 2020-02-21 DIAGNOSIS — E669 Obesity, unspecified: Secondary | ICD-10-CM | POA: Diagnosis not present

## 2020-02-21 HISTORY — PX: IUD REMOVAL: SHX5392

## 2020-02-21 LAB — POCT PREGNANCY, URINE: Preg Test, Ur: NEGATIVE

## 2020-02-21 SURGERY — REMOVAL, INTRAUTERINE DEVICE
Anesthesia: General | Site: Vagina

## 2020-02-21 MED ORDER — ONDANSETRON HCL 4 MG/2ML IJ SOLN
INTRAMUSCULAR | Status: DC | PRN
  Administered 2020-02-21: 4 mg via INTRAVENOUS

## 2020-02-21 MED ORDER — MIDAZOLAM HCL 5 MG/5ML IJ SOLN
INTRAMUSCULAR | Status: DC | PRN
  Administered 2020-02-21: 2 mg via INTRAVENOUS

## 2020-02-21 MED ORDER — PROPOFOL 500 MG/50ML IV EMUL
INTRAVENOUS | Status: DC | PRN
  Administered 2020-02-21: 125 ug/kg/min via INTRAVENOUS

## 2020-02-21 MED ORDER — IBUPROFEN 600 MG PO TABS: 600.0000 mg | ORAL_TABLET | Freq: Four times a day (QID) | ORAL | 3 refills | Status: DC | PRN

## 2020-02-21 MED ORDER — OXYCODONE HCL 5 MG PO TABS: 5.0000 mg | ORAL_TABLET | Freq: Once | ORAL | Status: DC | PRN

## 2020-02-21 MED ORDER — ACETAMINOPHEN 500 MG PO TABS: 500.0000 mg | ORAL_TABLET | Freq: Four times a day (QID) | ORAL | 0 refills | Status: DC | PRN

## 2020-02-21 MED ORDER — MIDAZOLAM HCL 2 MG/2ML IJ SOLN
INTRAMUSCULAR | Status: AC
  Filled 2020-02-21: qty 2

## 2020-02-21 MED ORDER — POVIDONE-IODINE 10 % EX SWAB
2.0000 "application " | Freq: Once | CUTANEOUS | Status: AC
  Administered 2020-02-21: 2 via TOPICAL

## 2020-02-21 MED ORDER — MEPERIDINE HCL 25 MG/ML IJ SOLN: 6.2500 mg | INTRAMUSCULAR | Status: DC | PRN

## 2020-02-21 MED ORDER — AMISULPRIDE (ANTIEMETIC) 5 MG/2ML IV SOLN: 10.0000 mg | Freq: Once | INTRAVENOUS | Status: DC | PRN

## 2020-02-21 MED ORDER — HYDROMORPHONE HCL 1 MG/ML IJ SOLN: 0.2500 mg | INTRAMUSCULAR | Status: DC | PRN

## 2020-02-21 MED ORDER — LACTATED RINGERS IV SOLN: INTRAVENOUS | Status: DC

## 2020-02-21 MED ORDER — LIDOCAINE HCL 1 % IJ SOLN
INTRAMUSCULAR | Status: DC | PRN
  Administered 2020-02-21: 2 mL

## 2020-02-21 MED ORDER — PROPOFOL 10 MG/ML IV BOLUS
INTRAVENOUS | Status: DC | PRN
  Administered 2020-02-21: 30 mg via INTRAVENOUS

## 2020-02-21 MED ORDER — FENTANYL CITRATE (PF) 100 MCG/2ML IJ SOLN
INTRAMUSCULAR | Status: AC
  Filled 2020-02-21: qty 2

## 2020-02-21 MED ORDER — PROMETHAZINE HCL 25 MG/ML IJ SOLN: 6.2500 mg | INTRAMUSCULAR | Status: DC | PRN

## 2020-02-21 MED ORDER — OXYCODONE HCL 5 MG/5ML PO SOLN: 5.0000 mg | Freq: Once | ORAL | Status: DC | PRN

## 2020-02-21 SURGICAL SUPPLY — 16 items
CATH ROBINSON RED A/P 16FR (CATHETERS) ×4 IMPLANT
GAUZE 4X4 16PLY RFD (DISPOSABLE) ×4 IMPLANT
GLOVE BIOGEL PI IND STRL 7.0 (GLOVE) ×2 IMPLANT
GLOVE BIOGEL PI IND STRL 7.5 (GLOVE) ×2 IMPLANT
GLOVE BIOGEL PI INDICATOR 7.0 (GLOVE) ×2
GLOVE BIOGEL PI INDICATOR 7.5 (GLOVE) ×2
GLOVE SURG SS PI 7.0 STRL IVOR (GLOVE) ×4 IMPLANT
GOWN STRL REUS W/TWL LRG LVL3 (GOWN DISPOSABLE) ×4 IMPLANT
GOWN STRL REUS W/TWL XL LVL3 (GOWN DISPOSABLE) ×4 IMPLANT
KIT PROCEDURE FLUENT (KITS) ×1 IMPLANT
NS IRRIG 1000ML POUR BTL (IV SOLUTION) ×1 IMPLANT
PACK VAGINAL MINOR WOMEN LF (CUSTOM PROCEDURE TRAY) ×4 IMPLANT
PAD OB MATERNITY 4.3X12.25 (PERSONAL CARE ITEMS) ×4 IMPLANT
PAD PREP 24X48 CUFFED NSTRL (MISCELLANEOUS) ×4 IMPLANT
SLEEVE SCD COMPRESS KNEE MED (MISCELLANEOUS) ×4 IMPLANT
TOWEL GREEN STERILE FF (TOWEL DISPOSABLE) ×8 IMPLANT

## 2020-02-21 NOTE — Discharge Instructions (Addendum)
Post Anesthesia Home Care Instructions  Activity: Get plenty of rest for the remainder of the day. A responsible individual must stay with you for 24 hours following the procedure.  For the next 24 hours, DO NOT: -Drive a car -Advertising copywriter -Drink alcoholic beverages -Take any medication unless instructed by your physician -Make any legal decisions or sign important papers.  Meals: Start with liquid foods such as gelatin or soup. Progress to regular foods as tolerated. Avoid greasy, spicy, heavy foods. If nausea and/or vomiting occur, drink only clear liquids until the nausea and/or vomiting subsides. Call your physician if vomiting continues.  Special Instructions/Symptoms: Your throat may feel dry or sore from the anesthesia or the breathing tube placed in your throat during surgery. If this causes discomfort, gargle with warm salt water. The discomfort should disappear within 24 hours.  If you had a scopolamine patch placed behind your ear for the management of post- operative nausea and/or vomiting:  1. The medication in the patch is effective for 72 hours, after which it should be removed.  Wrap patch in a tissue and discard in the trash. Wash hands thoroughly with soap and water. 2. You may remove the patch earlier than 72 hours if you experience unpleasant side effects which may include dry mouth, dizziness or visual disturbances. 3. Avoid touching the patch. Wash your hands with soap and water after contact with the patch.       We will discuss your surgery once again in detail at your post-op visit in two to four weeks. If you haven't already done so, please call to make your appointment as soon as possible.   These instructions give you information on caring for yourself after your procedure. Your doctor may also give you more specific instructions. Call your doctor if you have any problems or questions after your procedure. HOME CARE  Do not drive for 24  hours.  Wait 1 week before doing any activities that wear you out.  Do not stand for a long time.  Limit stair climbing to once or twice a day.  Rest often.  Continue with your usual diet.  Drink enough fluids to keep your pee (urine) clear or pale yellow.  If you have a hard time pooping (constipation), you may:  Take a medicine to help you go poop (laxative) as told by your doctor.  Eat more fruit and bran.  Drink more fluids.  Take showers, not baths, for as long as told by your doctor.  Do not swim or use a hot tub until your doctor says it is okay.  Have someone with you for 1day after the procedure.  Do not douche, use tampons, or have sex (intercourse) until seen by your doctor  Only take medicines as told by your doctor. Do not take aspirin. It can cause bleeding.  Keep all doctor visits. GET HELP IF:  You have cramps or pain not helped by medicine.  You have new pain in the belly (abdomen).  You have a bad smelling fluid coming from your vagina.  You have a rash.  You have problems with any medicine. GET HELP RIGHT AWAY IF:   You start to bleed more than a regular period.  You have a fever.  You have chest pain.  You have trouble breathing.  You feel dizzy or feel like passing out (fainting).  You pass out.  You have pain in the tops of your shoulders.  You have vaginal bleeding with or without  clumps of blood (blood clots). MAKE SURE YOU:  Understand these instructions.  Will watch your condition.  Will get help right away if you are not doing well or get worse. Document Released: 03/24/2008 Document Revised: 06/20/2013 Document Reviewed: 01/12/2013 [facility] Patient Information 2015 [place], [place]. This information is not intended to replace advice given to you by your health care provider. Make sure you discuss any questions you have with your health care provider.

## 2020-02-21 NOTE — Anesthesia Postprocedure Evaluation (Signed)
Anesthesia Post Note  Patient: Kiran Mensinger  Procedure(s) Performed: INTRAUTERINE DEVICE (IUD) REMOVAL (N/A Vagina )     Patient location during evaluation: PACU Anesthesia Type: General Level of consciousness: awake and alert Pain management: pain level controlled Vital Signs Assessment: post-procedure vital signs reviewed and stable Respiratory status: spontaneous breathing, nonlabored ventilation and respiratory function stable Cardiovascular status: blood pressure returned to baseline and stable Postop Assessment: no apparent nausea or vomiting Anesthetic complications: no   No complications documented.  Last Vitals:  Vitals:   02/21/20 1200 02/21/20 1225  BP: 129/88 140/88  Pulse: (!) 52 (!) 57  Resp: 12 16  Temp:  36.7 C  SpO2: 100% 100%    Last Pain:  Vitals:   02/21/20 1225  TempSrc:   PainSc: 0-No pain                 Lowella Curb

## 2020-02-21 NOTE — Op Note (Addendum)
Operative Note   02/21/2020  PRE-OP DIAGNOSIS: Lost IUD   POST-OP DIAGNOSIS: Lost IUD   SURGEON: Surgeon(s) and Role:    * [place] Bing, MD - Primary  ASSISTANT: None  PROCEDURE: Removal of intrauterine device  ANESTHESIA: Monitor Anesthesia Care   ESTIMATED BLOOD LOSS: 37mL  DRAINS: I/O cath done pre-op   TOTAL IV FLUIDS: per anesthesia note  SPECIMENS: None  VTE PROPHYLAXIS: SCDs to the bilateral lower extremities  ANTIBIOTICS: Not indicated  COMPLICATIONS: None  DISPOSITION: PACU - hemodynamically stable.  CONDITION: stable   FINDINGS: Exam under anesthesia revealed small, mobile mid plane uterus with no masses and bilateral adnexa without masses or fullness. On speculum exam, normal vagina and cervix and no IUD or strings seen. Removed IUD white, likely Mirena, intact with subcm strings attached to the base.   PROCEDURE IN DETAIL:  After informed consent was obtained, the patient was taken to the operating room where anesthesia was obtained without difficulty. The patient was positioned in the dorsal lithotomy position in [place] stirrups.  The patient's bladder was catheterized with an in and out foley catheter.  The patient was examined under anesthesia, with the above noted findings.  The bi-valved speculum was placed inside the patient's vagina, and the the anterior lip of the cervix was seen and grasped with the tenaculum.  A paracervical block was attempted but I had +blood on aspiration so none was done on either side.  I first attempted to use small polyp forceps, and I could eventually feel it with probing, and I could feel it, but I couldn't get a good hold of it so large forceps were used and the used was removed intact.   Excellent hemostasis was noted, and all instruments were removed, with excellent hemostasis noted throughout.  She was then taken out of dorsal lithotomy. The patient tolerated the procedure well.  Sponge, lap and instrument counts were  correct x2.  The patient was taken to recovery room in excellent condition.  Cornelia Copa MD Attending Center for [employer] [facility])

## 2020-02-21 NOTE — H&P (Signed)
Obstetrics & Gynecology Surgical H&P   Date of Surgery: 02/21/2020    Primary OBGYN: [facility]  Reason for Admission: lost IUD. Scheduled surgery  History of Present Illness: Sherri Butler is a 44 y.o. [phone] (Patient's last menstrual period was 01/13/2020.), with the above CC.  IUD strings avulsed with IUD removal attempt made with PCP and I was unable to remove in the office. U/s showed IUD intrauterine (in the lower uterine segment).   Patient doing well and not having any problems or issues  ROS: A 12-point review of systems was performed and negative, except as stated in the above HPI.  OBGYN History: As per HPI. OB History  Gravida Para Term Preterm AB Living  4 4 4     4   SAB TAB Ectopic Multiple Live Births        0 4    # Outcome Date GA Lbr Len/2nd Weight Sex Delivery Anes PTL Lv  4 Term 12/09/14 [redacted]w[redacted]d 10:42 / 00:10 3311 g F Vag-Spont EPI  LIV  3 Term 2010 [redacted]w[redacted]d  3232 g F Vag-Spont   LIV  2 Term 2005 [redacted]w[redacted]d  2948 g M Vag-Spont   LIV  1 Term 2003 [redacted]w[redacted]d  3062 g F Vag-Spont   LIV     Past Medical History: Past Medical History:  Diagnosis Date  . Obesity (BMI 35.0-39.9 without comorbidity) 2020  . Pregnancy induced hypertension 2016    Past Surgical History: Past Surgical History:  Procedure Laterality Date  . NO PAST SURGERIES      Family History:  History reviewed. No pertinent family history.  Social History:  Social History   Socioeconomic History  . Marital status: 2017    Spouse name: Media planner  . Number of children: 4  . Years of education: finished high school in [place]  . Highest education level: Not on file  Occupational History  . Occupation: [place], Print production planner.    Employer: [employer]  Tobacco Use  . Smoking status: Never Smoker  . Smokeless tobacco: Never Used  Vaping Use  . Vaping Use: Never used  Substance and Sexual Activity  . Alcohol use: No  . Drug use: No  . Sexual activity: Yes    Birth control/protection: I.U.D.   Other Topics Concern  . Not on file  Social History Narrative   Originally from [place]   Came to [place]. In 2002   Lives at home with her boyfriend, who is father of youngest child, and her 4 children   Father of other children lives in [place] not provide any support.   Social Determinants of Health   Financial Resource Strain:   . Difficulty of Paying Living Expenses: Not on file  Food Insecurity:   . Worried About [place] in the Last Year: Not on file  . Ran Out of Food in the Last Year: Not on file  Transportation Needs:   . Lack of Transportation (Medical): Not on file  . Lack of Transportation (Non-Medical): Not on file  Physical Activity:   . Days of Exercise per Week: Not on file  . Minutes of Exercise per Session: Not on file  Stress:   . Feeling of Stress : Not on file  Social Connections:   . Frequency of Communication with Friends and Family: Not on file  . Frequency of Social Gatherings with Friends and Family: Not on file  . Attends Religious Services: Not on file  . Active Member of Clubs or Organizations:  Not on file  . Attends Banker Meetings: Not on file  . Marital Status: Not on file  Intimate Partner Violence:   . Fear of Current or Ex-Partner: Not on file  . Emotionally Abused: Not on file  . Physically Abused: Not on file  . Sexually Abused: Not on file    Allergy: No Known Allergies  Current Outpatient Medications: Medications Prior to Admission  Medication Sig Dispense Refill Last Dose  . levonorgestrel (MIRENA) 20 MCG/24HR IUD 1 each by Intrauterine route once.        Hospital Medications: Current Facility-Administered Medications  Medication Dose Route Frequency Provider Last Rate Last Admin  . lactated ringers infusion   Intravenous Continuous Mellody Dance, MD 10 mL/hr at 02/21/20 0910 New Bag at 02/21/20 0910  . lactated ringers infusion   Intravenous Continuous Comern­o Bing, MD         Physical  Exam:  Current Vital Signs 24h Vital Sign Ranges  T 98.3 F (36.8 C) Temp  Avg: 98.3 F (36.8 C)  Min: 98.3 F (36.8 C)  Max: 98.3 F (36.8 C)  BP (!) 132/91 BP  Min: 132/91  Max: 132/91  HR 60 Pulse  Avg: 60  Min: 60  Max: 60  RR 19 Resp  Avg: 19  Min: 19  Max: 19  SaO2 100 % Room Air SpO2  Avg: 100 %  Min: 100 %  Max: 100 %       24 Hour I/O Current Shift I/O  Time Ins Outs No intake/output data recorded. No intake/output data recorded.    Body mass index is 35.57 kg/m. General appearance: Well nourished, well developed female in no acute distress.  Cardiovascular: S1, S2 normal, no murmur, rub or gallop, regular rate and rhythm Respiratory:  Clear to auscultation bilateral. Normal respiratory effort Abdomen: positive bowel sounds and no masses, hernias; diffusely non tender to palpation, non distended Neuro/Psych:  Normal mood and affect.  Skin:  Warm and dry.  Extremities: no clubbing, cyanosis, or edema.   Laboratory: UPT: neg COVID: neg  Imaging:  Narrative & Impression  CLINICAL DATA:  Lost IUD strings  EXAM: ULTRASOUND PELVIS TRANSVAGINAL  TECHNIQUE: Transvaginal ultrasound examination of the pelvis was performed including evaluation of the uterus, ovaries, adnexal regions, and pelvic cul-de-sac.  COMPARISON:  02/07/2010  FINDINGS: Uterus  Measurements: 9.1 x 3.9 x 5.5 cm = volume: 102 mL. Anteverted. Normal morphology without mass  Endometrium  Thickness: 3 mm. IUD identified at the lower uterine segment into cervix, abnormally low in position, does not extend to the fundal portion of the endometrial canal. IUD appears oblique in position, with inferior aspect of stem extending slightly into myometrial wall. No endometrial fluid.  Right ovary  Measurements: 4.0 x 2.3 x 2.0 cm = volume: 9.7 mL. Normal morphology without mass  Left ovary  Measurements: 2.9 x 2.1 x 2.7 cm = volume: 8.3 mL. Normal morphology without mass  Other  findings:  Trace free pelvic fluid.  No adnexal masses.  IMPRESSION: IUD identified obliquely at the lower uterine segment into cervix, abnormally low in position, with inferior aspect of stem extending slightly into myometrial wall.  Remainder of exam unremarkable.   Electronically Signed   By: Ulyses Southward M.D.   On: 02/01/2020 14:22   Assessment: Sherri Butler is a 44 y.o. [phone] (Patient's last menstrual period was 01/13/2020.) here for scheduled surgery; pt doing well  Plan: Patient amenable to go to OR for IUD removal. Can  proceed when OR is ready   Cornelia Copa MD Attending Center for [facility] (Faculty Practice) [phone]

## 2020-02-21 NOTE — Transfer of Care (Signed)
Immediate Anesthesia Transfer of Care Note  Patient: Sherri Butler  Procedure(s) Performed: INTRAUTERINE DEVICE (IUD) REMOVAL (N/A Vagina )  Patient Location: PACU  Anesthesia Type:MAC  Level of Consciousness: awake, alert  and oriented  Airway & Oxygen Therapy: Patient Spontanous Breathing and Patient connected to face mask oxygen  Post-op Assessment: Report given to RN and Post -op Vital signs reviewed and stable  Post vital signs: Reviewed and stable  Last Vitals:  Vitals Value Taken Time  BP    Temp    Pulse 50 02/21/20 1122  Resp 20 02/21/20 1122  SpO2 100 % 02/21/20 1122  Vitals shown include unvalidated device data.  Last Pain:  Vitals:   02/21/20 0902  TempSrc: Oral  PainSc: 0-No pain         Complications: No complications documented.

## 2020-02-21 NOTE — Anesthesia Preprocedure Evaluation (Signed)
Anesthesia Evaluation  Patient identified by MRN, date of birth, ID band Patient awake    Reviewed: Allergy & Precautions, NPO status , Patient's Chart, lab work & pertinent test results  History of Anesthesia Complications Negative for: history of anesthetic complications  Airway Mallampati: II  TM Distance: >3 FB Neck ROM: Full    Dental  (+) Teeth Intact   Pulmonary neg pulmonary ROS,    breath sounds clear to auscultation       Cardiovascular negative cardio ROS   Rhythm:Regular     Neuro/Psych negative neurological ROS  negative psych ROS   GI/Hepatic negative GI ROS, Neg liver ROS,   Endo/Other  negative endocrine ROS  Renal/GU negative Renal ROS     Musculoskeletal   Abdominal (+) + obese,   Peds  Hematology   Anesthesia Other Findings   Reproductive/Obstetrics                             Anesthesia Physical  Anesthesia Plan  ASA: II  Anesthesia Plan: General   Post-op Pain Management:    Induction: Intravenous  PONV Risk Score and Plan: 3 and Ondansetron, Dexamethasone, Midazolam and Treatment may vary due to age or medical condition  Airway Management Planned: LMA  Additional Equipment:   Intra-op Plan:   Post-operative Plan: Extubation in OR  Informed Consent: I have reviewed the patients History and Physical, chart, labs and discussed the procedure including the risks, benefits and alternatives for the proposed anesthesia with the patient or authorized representative who has indicated his/her understanding and acceptance.     Dental advisory given  Plan Discussed with: CRNA and Surgeon  Anesthesia Plan Comments:         Anesthesia Quick Evaluation

## 2020-02-22 ENCOUNTER — Encounter (HOSPITAL_BASED_OUTPATIENT_CLINIC_OR_DEPARTMENT_OTHER): Payer: Self-pay | Admitting: Obstetrics and Gynecology

## 2020-03-12 ENCOUNTER — Other Ambulatory Visit: Payer: Self-pay | Admitting: Obstetrics & Gynecology

## 2020-03-12 ENCOUNTER — Other Ambulatory Visit: Payer: Self-pay | Admitting: Obstetrics and Gynecology

## 2020-03-12 DIAGNOSIS — Z1231 Encounter for screening mammogram for malignant neoplasm of breast: Secondary | ICD-10-CM

## 2020-04-01 ENCOUNTER — Ambulatory Visit: Payer: No Typology Code available for payment source | Admitting: Obstetrics and Gynecology

## 2020-06-29 DIAGNOSIS — D219 Benign neoplasm of connective and other soft tissue, unspecified: Secondary | ICD-10-CM

## 2020-06-29 HISTORY — DX: Benign neoplasm of connective and other soft tissue, unspecified: D21.9

## 2020-10-14 ENCOUNTER — Ambulatory Visit (INDEPENDENT_AMBULATORY_CARE_PROVIDER_SITE_OTHER): Payer: No Typology Code available for payment source

## 2020-10-14 DIAGNOSIS — O09529 Supervision of elderly multigravida, unspecified trimester: Secondary | ICD-10-CM | POA: Insufficient documentation

## 2020-10-14 DIAGNOSIS — Z3A10 10 weeks gestation of pregnancy: Secondary | ICD-10-CM

## 2020-10-14 DIAGNOSIS — O09521 Supervision of elderly multigravida, first trimester: Secondary | ICD-10-CM

## 2020-10-14 DIAGNOSIS — O09891 Supervision of other high risk pregnancies, first trimester: Secondary | ICD-10-CM

## 2020-10-14 NOTE — Progress Notes (Signed)
  Virtual Visit via Telephone Note  I connected with [name] on 10/14/20 at  2:00 PM EDT by telephone and verified that I am speaking with the correct person using two identifiers.  Location: Patient: Home Provider: Haskel [facility]    I discussed the limitations, risks, security and privacy concerns of performing an evaluation and management service by telephone and the availability of in person appointments. I also discussed with the patient that there may be a patient responsible charge related to this service. The patient expressed understanding and agreed to proceed.   History of Present Illness: PRENATAL INTAKE SUMMARY  [name] presents today New OB Nurse Interview.  OB History    Gravida  5   Para  4   Term  4   Preterm      AB      Living  4     SAB      IAB      Ectopic      Multiple  0   Live Births  4          I have reviewed the patient's medical, obstetrical, social, and family histories, medications, and available lab results.  SUBJECTIVE She has no unusual complaints Pregnancy confirmed through [facility] with [facility] Pt is unable to answer FOB genetic questions - unsure FOB   Observations/Objective: Initial nurse interview for history/labs (New OB)  EDD: 05/08/2021 GA: [redacted]w[redacted]d G5P4 FHT: non face to face interview  GENERAL APPEARANCE: alert, non face to face interview   Assessment and Plan: High Risk pregnancy - AMA  [facility]: [facility]  Labs to be completed at next visit Download Babyscripts - Explained in detail Download Mychart  PHQ9= 4 GAD 7=0   Follow Up Instructions:   I discussed the assessment and treatment plan with the patient. The patient was provided an opportunity to ask questions and all were answered. The patient agreed with the plan and demonstrated an understanding of the instructions.   The patient was advised to call back or seek an in-person evaluation if the symptoms worsen or if the condition fails to  improve as anticipated.  I provided 15 minutes of non-face-to-face time during this encounter.   Dalphine Handing, CMA

## 2020-10-14 NOTE — Progress Notes (Signed)
Patient was assessed and managed by nursing staff during this encounter. I have reviewed the chart and agree with the documentation and plan. I have also made any necessary editorial changes.  Warden Fillers, MD 10/14/2020 2:44 PM

## 2020-10-21 ENCOUNTER — Other Ambulatory Visit (HOSPITAL_COMMUNITY)
Admission: RE | Admit: 2020-10-21 | Discharge: 2020-10-21 | Disposition: A | Discharge status: Home / Self Care | Payer: No Typology Code available for payment source | Source: Ambulatory Visit | Attending: Obstetrics and Gynecology | Admitting: Obstetrics and Gynecology

## 2020-10-21 ENCOUNTER — Other Ambulatory Visit: Payer: Self-pay

## 2020-10-21 ENCOUNTER — Ambulatory Visit (INDEPENDENT_AMBULATORY_CARE_PROVIDER_SITE_OTHER): Payer: Self-pay | Admitting: Obstetrics and Gynecology

## 2020-10-21 ENCOUNTER — Ambulatory Visit (INDEPENDENT_AMBULATORY_CARE_PROVIDER_SITE_OTHER): Payer: Self-pay

## 2020-10-21 VITALS — BP 112/77 | HR 79 | Wt 213.0 lb

## 2020-10-21 DIAGNOSIS — O020 Blighted ovum and nonhydatidiform mole: Secondary | ICD-10-CM

## 2020-10-21 DIAGNOSIS — O09529 Supervision of elderly multigravida, unspecified trimester: Secondary | ICD-10-CM | POA: Insufficient documentation

## 2020-10-21 DIAGNOSIS — Z3A11 11 weeks gestation of pregnancy: Secondary | ICD-10-CM | POA: Insufficient documentation

## 2020-10-21 DIAGNOSIS — O368390 Maternal care for abnormalities of the fetal heart rate or rhythm, unspecified trimester, not applicable or unspecified: Secondary | ICD-10-CM

## 2020-10-21 DIAGNOSIS — O021 Missed abortion: Secondary | ICD-10-CM

## 2020-10-21 MED ORDER — ASPIRIN 81 MG PO CHEW: 81.0000 mg | CHEWABLE_TABLET | Freq: Every day | ORAL | 7 refills | Status: DC

## 2020-10-21 NOTE — Patient Instructions (Signed)
Miscarriage A miscarriage is the loss of pregnancy before the 20th week. Most miscarriages happen during the first 3 months of pregnancy. Sometimes, a miscarriage can happen before a woman knows that she is pregnant. Having a miscarriage can be an emotional experience. If you have had a miscarriage, talk with your health care provider about any questions you may have about the loss of your baby, the grieving process, and your plans for future pregnancy. What are the causes? Many times, the cause of a miscarriage is not known. What increases the risk? The following factors may make a pregnant woman more likely to have a miscarriage: Certain medical conditions  Conditions that affect the hormone balance in the body, such as thyroid disease or polycystic ovary syndrome.  Diabetes.  Autoimmune disorders.  Infections.  Bleeding disorders.  Obesity. Lifestyle factors  Using products with tobacco or nicotine in them or being exposed to tobacco smoke.  Having alcohol.  Having large amounts of caffeine.  Recreational drug use. Problems with reproductive organs or structures  Cervical insufficiency. This is when the lowest part of the uterus (cervix) opens and thins before pregnancy is at term.  Having a condition called Asherman syndrome. This syndrome causes scarring in the uterus or causes the uterus to be abnormal in structure.  Fibrous growths, called fibroids, in the uterus.  Congenital abnormalities. These problems are present at birth.  Infection of the cervix or uterus. Personal or medical history  Injury (trauma).  Having had a miscarriage before.  Being younger than age 18 or older than age 35.  Exposure to harmful substances in the environment. This may include radiation or heavy metals, such as lead.  Use of certain medicines. What are the signs or symptoms? Symptoms of this condition include:  Vaginal bleeding or spotting, with or without cramps or  pain.  Pain or cramping in the abdomen or lower back.  Fluid or tissue coming out of the vagina. How is this diagnosed? This condition may be diagnosed based on:  A physical exam.  Ultrasound.  Lab tests, such as blood tests, urine tests, or swabs for infection. How is this treated? Treatment for a miscarriage is sometimes not needed if all the pregnancy tissue that was in the uterus comes out on its own, and there are no other problems such as infection or heavy bleeding. In other cases, this condition may be treated with:  Dilation and curettage (D&C). In this procedure, the cervix is stretched open and any remaining pregnancy tissue is removed from the lining of the uterus (endometrium).  Medicines. These may include: ? Antibiotic medicine, to treat infection. ? Medicine to help any remaining pregnancy tissue come out of the body. ? Medicine to reduce (contract) the size of the uterus. These medicines may be given if there is a lot of bleeding. If you have Rh-negative blood, you may be given an injection of a medicine called Rho(D) immune globulin. This medicine helps prevent problems with future pregnancies. Follow these instructions at home: Medicines  Take over-the-counter and prescription medicines only as told by your health care provider.  If you were prescribed antibiotic medicine, take it as told by your health care provider. Do not stop taking the antibiotic even if you start to feel better. Activity  Rest as told by your health care provider. Ask your health care provider what activities are safe for you.  Have someone help with home and family responsibilities during this time. General instructions  Monitor how much tissue   or blood clot material comes out of the vagina.  Do not have sex, douche, or put anything, such as tampons, in your vagina until your health care provider says it is okay.  To help you and your partner with the grieving process, talk with your  health care provider or get counseling.  When you are ready, meet with your health care provider to discuss any important steps you should take for your health. Also, discuss steps you should take to have a healthy pregnancy in the future.  Keep all follow-up visits. This is important.   Where to find more information  The [employer]: acog.org  [place] Department of Health and Cytogeneticist of Women's Health: http://hoffman.com/ Contact a health care provider if:  You have a fever or chills.  There is bad-smelling fluid coming from the vagina.  You have more bleeding instead of less.  Tissue or blood clots come out of your vagina. Get help right away if:  You have severe cramps or pain in your back or abdomen.  Heavy bleeding soaks through 2 large sanitary pads an hour for more than 2 hours.  You become light-headed or weak.  You faint.  You feel sad, and your sadness takes over your thoughts.  You think about hurting yourself. If you ever feel like you may hurt yourself or others, or have thoughts about taking your own life, get help right away. Go to your nearest emergency department or:  Call your local emergency services (911 in the [place]  Call a suicide crisis helpline, such as the National Suicide Prevention Lifeline at 669 823 9497. This is open 24 hours a day in the [place]  Text the Crisis Text Line at 902-711-8086 (in the [place] Summary  Most miscarriages happen in the first 3 months of pregnancy. Sometimes miscarriage happens before a woman knows that she is pregnant.  Follow instructions from your health care provider about medicines and activity.  To help you and your partner with grieving, talk with your health care provider or get counseling.  Keep all follow-up visits. This information is not intended to replace advice given to you by your health care provider. Make sure you discuss any questions you  have with your health care provider. Document Revised: 12/15/2019 Document Reviewed: 12/15/2019 Elsevier Patient Education  2021 Elsevier [employer]. Atlas of pelvic anatomy and gynecologic surgery (4th ed., pp. 205-212). [place], [place]: Elsevier.">  Dilation and Curettage or Vacuum Curettage Dilation and curettage (D&C) and vacuum curettage are minor procedures. A D&C involves stretching the cervix (dilation) and scraping the inside lining of the uterus with surgical instruments (curettage). During a D&C, tissue is gently scraped from the lining of the uterus (endometrium), starting from the top portion of the uterus down to the lowest part of the uterus. During a vacuum curettage, the lining and tissue in the uterus are removed with the use of gentle suction. Curettage may be performed to either diagnose or treat a problem. For diagnosis A diagnostic curettage may be done if you have:  Irregular bleeding in the uterus.  Bleeding with the development of clots.  Spotting between menstrual periods.  Prolonged menstrual periods or other abnormal bleeding.  Bleeding after menopause.  No menstrual period (amenorrhea).  A change in size and shape of the uterus.  Abnormal endometrial cells discovered during a Pap test. For treatment Curettage may be done:  To remove an IUD (intrauterine device).  To remove remaining placenta after giving birth.  During an abortion.  During a miscarriage.  To remove growths in the lining of the uterus.  To remove some rare types of non-cancerous lumps (fibroids). Tell a health care provider about:  Any allergies you have, including allergies to prescribed medicine or latex.  All medicines you are taking, including vitamins, herbs, eye drops, creams, and over-the-counter medicines.  Any blood-thinning medicine you may be taking.  Any problems you or family members have had with anesthetic medicines.  Any blood disorders you have.  Any surgeries  you have had.  Your medical history and any medical conditions you have.  Whether you are pregnant or may be pregnant.  Recent vaginal infections you have had.  Recent menstrual periods, bleeding problems you have had, and what form of birth control (contraception) you use. What are the risks? Generally, this is a safe procedure. However, problems may occur, including:  Infection.  Heavy vaginal bleeding.  Allergic reactions to medicines.  Damage to the cervix or other structures or organs.  Development of scar tissue (adhesions) inside the uterus. This can cause abnormal periods and may make it harder to get pregnant.  A hole (perforation) in the wall of the uterus. This is rare. What happens before the procedure? Staying hydrated Follow instructions from your health care provider about hydration, which may include:  Up to 2 hours before the procedure - you may continue to drink clear liquids, such as water, clear fruit juice, black coffee, and plain tea.   Eating and drinking restrictions Follow instructions from your health care provider about eating and drinking, which may include:  8 hours before the procedure - stop eating heavy meals or foods, such as meat, fried foods, or fatty foods.  6 hours before the procedure - stop eating light meals or foods, such as toast or cereal.  6 hours before the procedure - stop drinking milk or drinks that contain milk.  2 hours before the procedure - stop drinking clear liquids. If your health care provider told you to take your medicine(s) on the day of your procedure, take them with only a sip of water. Medicines  Ask your health care provider about: ? Changing or stopping your regular medicines. This is especially important if you are taking diabetes medicines or blood thinners. ? Taking medicines such as aspirin and ibuprofen. These medicines can thin your blood. Do not take these medicines unless your health care provider tells  you to take them. ? Taking over-the-counter medicines, vitamins, herbs, and supplements.  You may be given a medicine to soften the cervix in order to help with dilation. Surgery safety Ask your health care provider what steps will be taken to help prevent infection. These may include:  Removing hair at the surgery site.  Washing skin with a germ-killing soap.  Taking antibiotic medicine. General instructions  Do not use any products that contain nicotine or tobacco for at least 4 weeks before the procedure. These products include cigarettes, e-cigarettes, and chewing tobacco. If you need help quitting, ask your health care provider.  For 24 hours before your procedure, do not: ? Douche. ? Use tampons. ? Use medicines, creams, or suppositories in the vagina. ? Have sex.  You may be given a pregnancy test on the day of the procedure.  You may have a blood or urine sample taken.  Plan to have someone take you home from the hospital or clinic.  If you will be going home right after the procedure, plan to have  someone with you for 24 hours. What happens during the procedure?  An IV will be inserted into one of your veins.  You will be given one of the following: ? A medicine that numbs the area in and around the cervix (local anesthetic). ? A medicine to make you fall asleep (general anesthetic).  You will lie down on your back, with your feet in foot rests (stirrups).  The size and position of your uterus will be checked.  A lubricated instrument (speculum or Sims retractor) will be inserted into the back side of your vagina. The speculum will be used to hold apart the walls of your vagina so your health care provider can see your cervix.  A tool (tenaculum) will be attached to the lip of the cervix to stabilize it.  Your cervix will be softened and dilated. This may be done by: ? Taking medicine, either orally or vaginally. ? Having thin rods (laminaria) or gradual  widening instruments (tapered dilators) inserted into your cervix.  A small, sharp, curved instrument (curette) will be used to scrape a small amount of tissue or cells from the endometrium or cervical canal. In some cases, gentle suction is applied with the curette.  The curette will then be removed.  The cells will be taken to a lab for testing. The procedure may vary among health care providers and hospitals.   What happens after the procedure?  Your blood pressure, heart rate, breathing rate, and blood oxygen level will be monitored until you leave the hospital or clinic.  You may have mild cramping, backache, pain, and light bleeding or spotting. You may pass small blood clots from your vagina.  You may have to wear compression stockings. These stockings help to prevent blood clots and reduce swelling in your legs. Summary  Dilation and curettage (D&C) involves stretching (dilating) the cervix and scraping the inside lining of the uterus (curettage).  Follow your health care provider's instructions about when to stop eating and drinking, and whether to stop or change any medicines.  After the procedure, you may have mild cramping, backache, pain, and light bleeding or spotting. You may pass small blood clots from your vagina.  Plan to have someone take you home from the hospital or clinic. This information is not intended to replace advice given to you by your health care provider. Make sure you discuss any questions you have with your health care provider. Document Revised: 07/18/2019 Document Reviewed: 07/18/2019 Elsevier Patient Education  2021 [employer].

## 2020-10-21 NOTE — Progress Notes (Signed)
Pt presents today for NOB visit. Pt states she has an episode of vaginal bleeding on Friday but has not noticed any since. Pt does not have any other complaints at this time. Pt was made aware of [facility] information for genetic screening.

## 2020-10-21 NOTE — Progress Notes (Signed)
    CC: missed abortion Subjective:    Patient ID: Fonnie Mu, female    DOB: Jun 02, 1976, 45 y.o.   MRN: [id number]  HPI 45 yo G5P4 seen at [facility] office for new OB visit.  During the evaluation no fetal heart tones were noted, and formal u/s did not show fetal pole or fetal heart motion.  Scan resembled missed abortion or blighted ovum.  The patient did have an episode of light vaginal bleeding, but there is none today .  The patient also denies any cramping.   Review of Systems     Objective:   Physical Exam Genitourinary:    Comments: SVE: cervix closed, no active bleeding or old blood in the vault.  Uterus slightly enlarged and NT  u/s viewed in person.  Uterus has thickened endometrial stripe with large gestational sac without fetal pole or IUP. Vitals:   10/21/20 1310  BP: 112/77  Pulse: 79         Assessment & Plan:   1. Unable to hear fetal heart tones as reason for ultrasound scan See above - US OB Limited; Future  2. Blighted ovum Baseline bhcg today.  Spoke to pt and her spouse.  She was appropriately emotional.  Pt given 3 options for management  1.  Expectant management.  She understand it could take several weeks or months before her body begins to clear the POC.  Bleeding could be light to very heavy.  2. Misoprostol: Discussed buccal cytotec to initiate process of clearing the POC. She is aware there could be increased bleeding and discomfort to the point of needing surgical intervention.  3. Operative management, D and C:  Suction D and C with sedation or general anesthesia.  Risks and benefits given including bleeding, infection or uterine perforation.  This may lead to quicker resolution of the condition, but scheduling may be several days to weeks out.  Spouse was present on the phone as diagnosis and treatment options were discussed.  The family requested more time to discuss and eval options.  Information placed into the AVS system.  Pt will follow up  in office in 1 week or call with her decision.  Pt advised to seek care at [facility] or ER if she experiences discomfort or bleeding which she feels is too extreme. - Beta hCG quant (ref lab)  3. Missed abortion Baseline quant drawn. - Beta hCG quant (ref lab)    Warden Fillers, MD Faculty Attending, [facility]

## 2020-10-22 LAB — CERVICOVAGINAL ANCILLARY ONLY
Chlamydia: NEGATIVE
Comment: NEGATIVE
Comment: NORMAL
Neisseria Gonorrhea: NEGATIVE

## 2020-10-22 LAB — BETA HCG QUANT (REF LAB): hCG Quant: 23157 m[IU]/mL

## 2020-10-27 ENCOUNTER — Other Ambulatory Visit: Payer: Self-pay

## 2020-10-27 ENCOUNTER — Inpatient Hospital Stay (HOSPITAL_COMMUNITY)
Admission: AD | Admit: 2020-10-27 | Discharge: 2020-10-27 | Disposition: A | Discharge status: Home / Self Care | Payer: No Typology Code available for payment source | Attending: Obstetrics and Gynecology | Admitting: Obstetrics and Gynecology

## 2020-10-27 ENCOUNTER — Inpatient Hospital Stay (HOSPITAL_COMMUNITY): Payer: No Typology Code available for payment source

## 2020-10-27 DIAGNOSIS — Z3A12 12 weeks gestation of pregnancy: Secondary | ICD-10-CM | POA: Insufficient documentation

## 2020-10-27 DIAGNOSIS — O020 Blighted ovum and nonhydatidiform mole: Secondary | ICD-10-CM

## 2020-10-27 DIAGNOSIS — O021 Missed abortion: Secondary | ICD-10-CM | POA: Insufficient documentation

## 2020-10-27 DIAGNOSIS — O209 Hemorrhage in early pregnancy, unspecified: Secondary | ICD-10-CM

## 2020-10-27 LAB — URINALYSIS, ROUTINE W REFLEX MICROSCOPIC
Bilirubin Urine: NEGATIVE
Glucose, UA: NEGATIVE mg/dL
Ketones, ur: NEGATIVE mg/dL
Leukocytes,Ua: NEGATIVE
Nitrite: NEGATIVE
Protein, ur: NEGATIVE mg/dL
Specific Gravity, Urine: 1.015 (ref 1.005–1.030)
pH: 8 (ref 5.0–8.0)

## 2020-10-27 LAB — CBC
HCT: 35.3 % — ABNORMAL LOW (ref 36.0–46.0)
Hemoglobin: 11.4 g/dL — ABNORMAL LOW (ref 12.0–15.0)
MCH: 27.5 pg (ref 26.0–34.0)
MCHC: 32.3 g/dL (ref 30.0–36.0)
MCV: 85.3 fL (ref 80.0–100.0)
Platelets: 246 10*3/uL (ref 150–400)
RBC: 4.14 MIL/uL (ref 3.87–5.11)
RDW: 13.2 % (ref 11.5–15.5)
WBC: 5.1 10*3/uL (ref 4.0–10.5)
nRBC: 0 % (ref 0.0–0.2)

## 2020-10-27 LAB — URINALYSIS, MICROSCOPIC (REFLEX)

## 2020-10-27 LAB — HCG, QUANTITATIVE, PREGNANCY: hCG, Beta Chain, Quant, S: 23740 m[IU]/mL — ABNORMAL HIGH (ref <5)

## 2020-10-27 MED ORDER — TRAMADOL HCL 50 MG PO TABS: 50.0000 mg | ORAL_TABLET | Freq: Four times a day (QID) | ORAL | 0 refills | Status: DC | PRN

## 2020-10-27 MED ORDER — TRAMADOL HCL 50 MG PO TABS
100.0000 mg | ORAL_TABLET | Freq: Once | ORAL | Status: AC
  Administered 2020-10-27: 100 mg via ORAL
  Filled 2020-10-27: qty 2

## 2020-10-27 NOTE — MAU Provider Note (Signed)
Chief Complaint: Abdominal Pain and Vaginal Bleeding   Event Date/Time   First Provider Initiated Contact with Patient 10/27/20 1316      SUBJECTIVE HPI: Sherri Butler is a 45 y.o. G5P4004 at [redacted]w[redacted]d by LMP with recent dx of blighted ovum who presents to maternity admissions reporting onset of bright red vaginal bleeding and cramping today. She was seen at [facility] on 10/21/20 and diagnosed with blighted ovum.  She was counseled and selected expectant management with plan to follow up at the office. She has appt on 10/29/20 with Dr Donavan Foil.  She had light bleeding before her appt on 10/21/20 but none since then until today. She went to the bathroom and had bleeding into the toilet and when she wiped. It is not heavy enough to require a pad. There is menstrual like cramping in her low abdomen that started after the bleeding. It is intermittent cramping pain. She has not tried any treatments.  There are no other associated symptoms.    HPI  Past Medical History:  Diagnosis Date  . Obesity (BMI 35.0-39.9 without comorbidity) 2020  . Pregnancy induced hypertension 2016   Past Surgical History:  Procedure Laterality Date  . IUD REMOVAL N/A 02/21/2020   Procedure: INTRAUTERINE DEVICE (IUD) REMOVAL;  Surgeon: [place] Bing, MD;  Location: [facility];  Service: Gynecology;  Laterality: N/A;  . NO PAST SURGERIES     Social History   Socioeconomic History  . Marital status: Media planner    Spouse name: Sherri Butler  . Number of children: 4  . Years of education: finished high school in [place]  . Highest education level: Not on file  Occupational History  . Occupation: Print production planner, Catering manager.    Employer: [employer]  Tobacco Use  . Smoking status: Never Smoker  . Smokeless tobacco: Never Used  Vaping Use  . Vaping Use: Never used  Substance and Sexual Activity  . Alcohol use: No  . Drug use: No  . Sexual activity: Yes    Birth control/protection: None  Other Topics Concern  . Not on file   Social History Narrative   Originally from [place]   Came to [place]. In 2002   Lives at home with her boyfriend, who is father of youngest child, and her 4 children   Father of other children lives in [place] not provide any support.   Social Determinants of Health   Financial Resource Strain: Not on file  Food Insecurity: Not on file  Transportation Needs: Not on file  Physical Activity: Not on file  Stress: Not on file  Social Connections: Not on file  Intimate Partner Violence: Not on file   No current facility-administered medications on file prior to encounter.   Current Outpatient Medications on File Prior to Encounter  Medication Sig Dispense Refill  . Prenatal Vit-Fe Fumarate-FA (MULTIVITAMIN-PRENATAL) 27-0.8 MG TABS tablet Take 1 tablet by mouth daily at 12 noon.     No Known Allergies  ROS:  Review of Systems  Constitutional: Negative for chills, fatigue and fever.  Respiratory: Negative for shortness of breath.   Cardiovascular: Negative for chest pain.  Gastrointestinal: Positive for abdominal pain.  Genitourinary: Positive for pelvic pain and vaginal bleeding. Negative for difficulty urinating, dysuria, flank pain, vaginal discharge and vaginal pain.  Neurological: Negative for dizziness and headaches.  Psychiatric/Behavioral: Negative.      I have reviewed patient's Past Medical Hx, Surgical Hx, Family Hx, Social Hx, medications and allergies.   Physical Exam   Patient  Vitals for the past 24 hrs:  BP Temp Temp src Pulse Resp SpO2  10/27/20 1221 132/85 98.5 F (36.9 C) Oral 80 16 100 %   Constitutional: Well-developed, well-nourished female in no acute distress.  Cardiovascular: normal rate Respiratory: normal effort GI: Abd soft, non-tender. Pos BS x 4 MS: Extremities nontender, no edema, normal ROM Neurologic: Alert and oriented x 4.  GU: Neg CVAT.  PELVIC EXAM: Cervix pink, visually closed, without lesion, small amount dark red bleeding with small  clots, 1 fox swab used to visualize cervix, vaginal walls and external genitalia normal    LAB RESULTS Results for orders placed or performed during the hospital encounter of 10/27/20 (from the past 24 hour(s))  Urinalysis, Routine w reflex microscopic Urine, Clean Catch     Status: Abnormal   Collection Time: 10/27/20 12:24 PM  Result Value Ref Range   Color, Urine YELLOW YELLOW   APPearance CLOUDY (A) CLEAR   Specific Gravity, Urine 1.015 1.005 - 1.030   pH 8.0 5.0 - 8.0   Glucose, UA NEGATIVE NEGATIVE mg/dL   Hgb urine dipstick TRACE (A) NEGATIVE   Bilirubin Urine NEGATIVE NEGATIVE   Ketones, ur NEGATIVE NEGATIVE mg/dL   Protein, ur NEGATIVE NEGATIVE mg/dL   Nitrite NEGATIVE NEGATIVE   Leukocytes,Ua NEGATIVE NEGATIVE  Urinalysis, Microscopic (reflex)     Status: Abnormal   Collection Time: 10/27/20 12:24 PM  Result Value Ref Range   RBC / HPF 0-5 0 - 5 RBC/hpf   WBC, UA 0-5 0 - 5 WBC/hpf   Bacteria, UA MANY (A) NONE SEEN   Squamous Epithelial / LPF 0-5 0 - 5  hCG, quantitative, pregnancy     Status: Abnormal   Collection Time: 10/27/20  1:31 PM  Result Value Ref Range   hCG, Beta Chain, Quant, S 23,740 (H) <5 mIU/mL  CBC     Status: Abnormal   Collection Time: 10/27/20  1:31 PM  Result Value Ref Range   WBC 5.1 4.0 - 10.5 K/uL   RBC 4.14 3.87 - 5.11 MIL/uL   Hemoglobin 11.4 (L) 12.0 - 15.0 g/dL   HCT 68.1 (L) 27.5 - 17.0 %   MCV 85.3 80.0 - 100.0 fL   MCH 27.5 26.0 - 34.0 pg   MCHC 32.3 30.0 - 36.0 g/dL   RDW 01.7 49.4 - 49.6 %   Platelets 246 150 - 400 K/uL   nRBC 0.0 0.0 - 0.2 %       IMAGING US OB Comp Less 14 Wks  Result Date: 10/27/2020 CLINICAL DATA:  Vaginal bleeding. Blighted ovum diagnosed October 21, 2020 at an outside facility. EXAM: OBSTETRIC <14 WK ULTRASOUND TECHNIQUE: Transabdominal ultrasound was performed for evaluation of the gestation as well as the maternal uterus and adnexal regions. COMPARISON:  October 21, 2020 FINDINGS: Intrauterine gestational  sac: A rounded fluid collection is seen within the endometrium consistent with the gestational sac identified October 21, 2020. Yolk sac:  Not Visualized. Embryo:  Not Visualized. MSD:  29.8 mm   8 w   0 d Subchorionic hemorrhage: The decidual reaction surrounding the gestational sac is more than typically seen. Inferiorly, there appears to be a small subchorionic hemorrhage. More superiorly the decidual reaction is heterogeneous in echogenicity containing anechoic serpiginous regions. These findings are similar since October 21, 2020. Maternal uterus/adnexae: The ovaries are normal in appearance. IMPRESSION: 1. Again noted is an enlarged gestational sac without a yolk sac or a fetal pole consistent with a blighted ovum as described on  the outside ultrasound. 2. Inferior to the gestational sac is an apparent small subchorionic hemorrhage. 3. Superior to the gestational sac, the decidual reaction is more than typically seen and very heterogeneous with internal serpiginous anechoic regions. This fine could represent additional subchorionic hemorrhage although the finding is not significantly changed since October 21, 2020 when bleeding was not being described. This finding does not have the classic appearance of a molar pregnancy. Recommend correlation with beta HCG and follow-up imaging. Electronically Signed   By: Gerome Sam III M.D   On: 10/27/2020 15:30   US OB Limited  Result Date: 10/21/2020 ----------------------------------------------------------------------  OBSTETRICS REPORT                         (Signed Final 10/21/2020 05:34 pm) ---------------------------------------------------------------------- Patient Info  ID #:        [id number]                          D.O.B.:  08-Aug-1975 (44 yrs)  Name:        ARTHELLA HEADINGS                     Visit Date: 10/21/2020 02:48 pm ---------------------------------------------------------------------- Performed By  Attending:         Mariel Aloe MD       Ref. Address:       [facility]                                                               [facility]                                                               [facility]  Performed By:      Angelica Pou RN         Location[facility]          [facility]  Referred By:       Warden Fillers                     MD ---------------------------------------------------------------------- Orders  #   Description                          Code  Ordered By  1   US OB LIMITED                        [id number]      Mariel AloeLAWRENCE BASS ----------------------------------------------------------------------  #   Order #                    Accession #                 Episode #  1   [id number]                  [id number]                  [id number] ---------------------------------------------------------------------- Indications  [redacted] weeks gestation of pregnancy                  Z3A.08  Vaginal bleeding in pregnancy, first trimester  O46.91  Unable to hear fetal heart tones as reason for  O76  ultrasound ---------------------------------------------------------------------- Fetal Evaluation  Num Of Fetuses:          1  Cardiac Activity:        No embryo visualized  Comment:     Enlarged gestational sac noted with no IUP or fetal pole.  Consistent               with blighted ovum.  Results relayed to the patient ---------------------------------------------------------------------- Biometry  GS:       29.7   mm     G. Age:  8w 5d                    EDD:   05/28/21 ---------------------------------------------------------------------- Gestational Age  Best:           8w 5d     Det. By:  U/S G S (10/21/20)         EDD:  05/28/21 ---------------------------------------------------------------------- Comments  Well defined Gestational sac at 3915w5d. No fetal pole.  ----------------------------------------------------------------------                  Mariel AloeLawrence Bass, MD Electronically Signed Final Report   10/21/2020 05:34 pm ----------------------------------------------------------------------   MAU Management/MDM: Orders Placed This Encounter  Procedures  . US OB Comp Less 14 Wks  . Urinalysis, Routine w reflex microscopic Urine, Clean Catch  . hCG, quantitative, pregnancy  . CBC  . Urinalysis, Microscopic (reflex)  . Discharge patient    Meds ordered this encounter  Medications  . traMADol (ULTRAM) tablet 100 mg    US today similar to US in office, blighted ovum at [redacted] weeks gestational age by MSD noted.  Small SCH seen. Discussed results with pt.  Pt would now like to schedule D&C. She does not want Cytotec.  Message sent to office to schedule D&C. Bleeding precautions/reasons to return to MAU reviewed.   ASSESSMENT 1. Missed abortion   2. Blighted ovum   3. Vaginal bleeding in pregnancy, first trimester     PLAN Discharge home with bleeding precautions  Allergies as of 10/27/2020   No Known Allergies     Medication List    TAKE these medications   multivitamin-prenatal 27-0.8 MG Tabs tablet Take 1 tablet by mouth daily at 12 noon.       Follow-up Information    [facility].   Specialty: Obstetrics and Gynecology Why: The office will call you to schedule your D&C procedure and follow up  visits.   Contact information: [address], [address] [place] [address] [phone]       [facility] Follow up.   Specialty: Obstetrics and Gynecology Why: Return as needed for heavy bleeding or severe abdominal pain or other emergencies. Contact information: [address] [id number] mc [place] [address] [phone]              Sharen Counter Certified Nurse-Midwife 10/27/2020  3:56 PM

## 2020-10-27 NOTE — MAU Note (Signed)
Pt reports to mau with c/o lower abd cramping and bleeding that started this morning.

## 2020-10-28 ENCOUNTER — Telehealth: Payer: Self-pay | Admitting: *Deleted

## 2020-10-28 ENCOUNTER — Other Ambulatory Visit (HOSPITAL_COMMUNITY): Payer: No Typology Code available for payment source

## 2020-10-28 NOTE — Telephone Encounter (Signed)
Call to patient. Advised surgery is scheduled for 10-30-20 at 1415 pm and arrive at 1215 pm at [facility]. Advised to expect call from surgery center with additional instructions.  Encounter closed.

## 2020-10-29 ENCOUNTER — Encounter (HOSPITAL_BASED_OUTPATIENT_CLINIC_OR_DEPARTMENT_OTHER): Payer: Self-pay | Admitting: Obstetrics and Gynecology

## 2020-10-29 ENCOUNTER — Other Ambulatory Visit (HOSPITAL_COMMUNITY)
Admission: RE | Admit: 2020-10-29 | Discharge: 2020-10-29 | Disposition: A | Discharge status: Home / Self Care | Payer: No Typology Code available for payment source | Source: Ambulatory Visit | Attending: Obstetrics and Gynecology | Admitting: Obstetrics and Gynecology

## 2020-10-29 ENCOUNTER — Other Ambulatory Visit: Payer: Self-pay

## 2020-10-29 ENCOUNTER — Ambulatory Visit: Payer: No Typology Code available for payment source | Admitting: Obstetrics and Gynecology

## 2020-10-29 DIAGNOSIS — Z01812 Encounter for preprocedural laboratory examination: Secondary | ICD-10-CM | POA: Insufficient documentation

## 2020-10-29 DIAGNOSIS — Z20822 Contact with and (suspected) exposure to covid-19: Secondary | ICD-10-CM | POA: Insufficient documentation

## 2020-10-29 NOTE — H&P (Signed)
Sherri Butler is an 45 y.o. female [phone] with missed AB at 8 weeks. Desires surgerical management  Blood type O positive  Menstrual History: Menarche age: 31 Patient's last menstrual period was 08/01/2020.    Past Medical History:  Diagnosis Date  . Obesity (BMI 35.0-39.9 without comorbidity) 2020  . Pregnancy induced hypertension 2016    Past Surgical History:  Procedure Laterality Date  . IUD REMOVAL N/A 02/21/2020   Procedure: INTRAUTERINE DEVICE (IUD) REMOVAL;  Surgeon: [place] Bing, MD;  Location: [facility];  Service: Gynecology;  Laterality: N/A;  . NO PAST SURGERIES      History reviewed. No pertinent family history.  Social History:  reports that she has never smoked. She has never used smokeless tobacco. She reports that she does not drink alcohol and does not use drugs.  Allergies: No Known Allergies  No medications prior to admission.    Review of Systems  Constitutional: Negative.   Cardiovascular: Negative.   Gastrointestinal: Negative.   Genitourinary: Positive for vaginal bleeding.    Height 5\' 4"  (1.626 m), weight 96.6 kg, last menstrual period 08/01/2020. Physical Exam Constitutional:      Appearance: Normal appearance.  Cardiovascular:     Rate and Rhythm: Normal rate.     Heart sounds: Normal heart sounds.  Pulmonary:     Breath sounds: Normal breath sounds.  Abdominal:     General: Bowel sounds are normal.     Palpations: Abdomen is soft.  Genitourinary:    Comments: Deferred to OR Neurological:     Mental Status: She is alert.     No results found for this or any previous visit (from the past 24 hour(s)).  09/29/2020 OB Comp Less 14 Wks  Result Date: 10/27/2020 CLINICAL DATA:  Vaginal bleeding. Blighted ovum diagnosed October 21, 2020 at an outside facility. EXAM: OBSTETRIC <14 WK ULTRASOUND TECHNIQUE: Transabdominal ultrasound was performed for evaluation of the gestation as well as the maternal uterus and adnexal regions.  COMPARISON:  October 21, 2020 FINDINGS: Intrauterine gestational sac: A rounded fluid collection is seen within the endometrium consistent with the gestational sac identified October 21, 2020. Yolk sac:  Not Visualized. Embryo:  Not Visualized. MSD:  29.8 mm   8 w   0 d Subchorionic hemorrhage: The decidual reaction surrounding the gestational sac is more than typically seen. Inferiorly, there appears to be a small subchorionic hemorrhage. More superiorly the decidual reaction is heterogeneous in echogenicity containing anechoic serpiginous regions. These findings are similar since October 21, 2020. Maternal uterus/adnexae: The ovaries are normal in appearance. IMPRESSION: 1. Again noted is an enlarged gestational sac without a yolk sac or a fetal pole consistent with a blighted ovum as described on the outside ultrasound. 2. Inferior to the gestational sac is an apparent small subchorionic hemorrhage. 3. Superior to the gestational sac, the decidual reaction is more than typically seen and very heterogeneous with internal serpiginous anechoic regions. This fine could represent additional subchorionic hemorrhage although the finding is not significantly changed since October 21, 2020 when bleeding was not being described. This finding does not have the classic appearance of a molar pregnancy. Recommend correlation with beta HCG and follow-up imaging. Electronically Signed   By: October 23, 2020 III M.D   On: 10/27/2020 15:30    Assessment/Plan: Missed AB  D & C desired by pt. R/B/Post op care reviewed with pt. Pt verbalized understanding and desires to proceed.   12/27/2020 10/29/2020, 9:41 AM

## 2020-10-30 ENCOUNTER — Ambulatory Visit (HOSPITAL_BASED_OUTPATIENT_CLINIC_OR_DEPARTMENT_OTHER)
Admission: RE | Admit: 2020-10-30 | Discharge: 2020-10-30 | Disposition: A | Discharge status: Home / Self Care | Payer: No Typology Code available for payment source | Attending: Obstetrics and Gynecology | Admitting: Obstetrics and Gynecology

## 2020-10-30 ENCOUNTER — Other Ambulatory Visit: Payer: Self-pay

## 2020-10-30 ENCOUNTER — Encounter (HOSPITAL_BASED_OUTPATIENT_CLINIC_OR_DEPARTMENT_OTHER)
Admission: RE | Disposition: A | Discharge status: Home / Self Care | Payer: Self-pay | Source: Home / Self Care | Attending: Obstetrics and Gynecology

## 2020-10-30 ENCOUNTER — Ambulatory Visit (HOSPITAL_BASED_OUTPATIENT_CLINIC_OR_DEPARTMENT_OTHER): Payer: No Typology Code available for payment source | Admitting: Anesthesiology

## 2020-10-30 ENCOUNTER — Encounter (HOSPITAL_BASED_OUTPATIENT_CLINIC_OR_DEPARTMENT_OTHER): Payer: Self-pay | Admitting: Obstetrics and Gynecology

## 2020-10-30 DIAGNOSIS — O021 Missed abortion: Secondary | ICD-10-CM | POA: Insufficient documentation

## 2020-10-30 HISTORY — PX: DILATION AND EVACUATION: SHX1459

## 2020-10-30 LAB — SARS CORONAVIRUS 2 (TAT 6-24 HRS): SARS Coronavirus 2: NEGATIVE

## 2020-10-30 SURGERY — DILATION AND EVACUATION, UTERUS
Anesthesia: General | Site: Uterus

## 2020-10-30 MED ORDER — OXYCODONE HCL 5 MG PO TABS: 5.0000 mg | ORAL_TABLET | Freq: Once | ORAL | Status: DC | PRN

## 2020-10-30 MED ORDER — SODIUM CHLORIDE 0.9 % IV SOLN
INTRAVENOUS | Status: AC
  Filled 2020-10-30: qty 100

## 2020-10-30 MED ORDER — DOXYCYCLINE HYCLATE 100 MG IV SOLR
200.0000 mg | INTRAVENOUS | Status: AC
  Administered 2020-10-30 (×2): 200 mg via INTRAVENOUS
  Filled 2020-10-30: qty 200

## 2020-10-30 MED ORDER — OXYCODONE HCL 5 MG/5ML PO SOLN: 5.0000 mg | Freq: Once | ORAL | Status: DC | PRN

## 2020-10-30 MED ORDER — BUPIVACAINE HCL 0.25 % IJ SOLN
INTRAMUSCULAR | Status: DC | PRN
  Administered 2020-10-30: 10 mL

## 2020-10-30 MED ORDER — POVIDONE-IODINE 10 % EX SWAB: 2.0000 "application " | Freq: Once | CUTANEOUS | Status: DC

## 2020-10-30 MED ORDER — SODIUM CHLORIDE 0.9 % IV SOLN
INTRAVENOUS | Status: AC
  Filled 2020-10-30 (×2): qty 100

## 2020-10-30 MED ORDER — METHYLERGONOVINE MALEATE 0.2 MG/ML IJ SOLN
INTRAMUSCULAR | Status: AC
  Filled 2020-10-30: qty 1

## 2020-10-30 MED ORDER — FENTANYL CITRATE (PF) 100 MCG/2ML IJ SOLN
INTRAMUSCULAR | Status: DC | PRN
  Administered 2020-10-30 (×2): 50 ug via INTRAVENOUS

## 2020-10-30 MED ORDER — LIDOCAINE 2% (20 MG/ML) 5 ML SYRINGE
INTRAMUSCULAR | Status: DC | PRN
  Administered 2020-10-30: 100 mg via INTRAVENOUS

## 2020-10-30 MED ORDER — ACETAMINOPHEN 500 MG PO TABS
ORAL_TABLET | ORAL | Status: AC
  Filled 2020-10-30: qty 2

## 2020-10-30 MED ORDER — FENTANYL CITRATE (PF) 100 MCG/2ML IJ SOLN
INTRAMUSCULAR | Status: AC
  Filled 2020-10-30: qty 2

## 2020-10-30 MED ORDER — MIDAZOLAM HCL 2 MG/2ML IJ SOLN
INTRAMUSCULAR | Status: AC
  Filled 2020-10-30: qty 2

## 2020-10-30 MED ORDER — MISOPROSTOL 200 MCG PO TABS
ORAL_TABLET | ORAL | Status: DC | PRN
  Administered 2020-10-30: 200 ug via RECTAL

## 2020-10-30 MED ORDER — KETOROLAC TROMETHAMINE 15 MG/ML IJ SOLN: 15.0000 mg | INTRAMUSCULAR | Status: DC

## 2020-10-30 MED ORDER — LACTATED RINGERS IV SOLN: INTRAVENOUS | Status: DC

## 2020-10-30 MED ORDER — ACETAMINOPHEN 500 MG PO TABS
1000.0000 mg | ORAL_TABLET | ORAL | Status: AC
  Administered 2020-10-30: 1000 mg via ORAL

## 2020-10-30 MED ORDER — PROMETHAZINE HCL 25 MG/ML IJ SOLN: 6.2500 mg | INTRAMUSCULAR | Status: DC | PRN

## 2020-10-30 MED ORDER — DEXAMETHASONE SODIUM PHOSPHATE 4 MG/ML IJ SOLN
INTRAMUSCULAR | Status: DC | PRN
  Administered 2020-10-30: 10 mg via INTRAVENOUS

## 2020-10-30 MED ORDER — CARBOPROST TROMETHAMINE 250 MCG/ML IM SOLN
INTRAMUSCULAR | Status: AC
  Filled 2020-10-30: qty 1

## 2020-10-30 MED ORDER — OXYCODONE HCL 5 MG PO TABS: 5.0000 mg | ORAL_TABLET | Freq: Four times a day (QID) | ORAL | 0 refills | Status: DC | PRN

## 2020-10-30 MED ORDER — SOD CITRATE-CITRIC ACID 500-334 MG/5ML PO SOLN
ORAL | Status: AC
  Filled 2020-10-30: qty 15

## 2020-10-30 MED ORDER — MIDAZOLAM HCL 5 MG/5ML IJ SOLN
INTRAMUSCULAR | Status: DC | PRN
  Administered 2020-10-30: 2 mg via INTRAVENOUS

## 2020-10-30 MED ORDER — SOD CITRATE-CITRIC ACID 500-334 MG/5ML PO SOLN
30.0000 mL | ORAL | Status: AC
  Administered 2020-10-30: 30 mL via ORAL

## 2020-10-30 MED ORDER — KETOROLAC TROMETHAMINE 15 MG/ML IJ SOLN
INTRAMUSCULAR | Status: AC
  Filled 2020-10-30: qty 1

## 2020-10-30 MED ORDER — KETOROLAC TROMETHAMINE 30 MG/ML IJ SOLN
INTRAMUSCULAR | Status: DC | PRN
  Administered 2020-10-30: 30 mg via INTRAVENOUS

## 2020-10-30 MED ORDER — PROPOFOL 10 MG/ML IV BOLUS
INTRAVENOUS | Status: AC
  Filled 2020-10-30: qty 20

## 2020-10-30 MED ORDER — IBUPROFEN 800 MG PO TABS: 800.0000 mg | ORAL_TABLET | Freq: Three times a day (TID) | ORAL | 0 refills | Status: AC | PRN

## 2020-10-30 MED ORDER — ONDANSETRON HCL 4 MG/2ML IJ SOLN
INTRAMUSCULAR | Status: DC | PRN
  Administered 2020-10-30: 4 mg via INTRAVENOUS

## 2020-10-30 MED ORDER — PROPOFOL 10 MG/ML IV BOLUS
INTRAVENOUS | Status: DC | PRN
  Administered 2020-10-30: 200 mg via INTRAVENOUS

## 2020-10-30 MED ORDER — FENTANYL CITRATE (PF) 100 MCG/2ML IJ SOLN: 25.0000 ug | INTRAMUSCULAR | Status: DC | PRN

## 2020-10-30 SURGICAL SUPPLY — 21 items
CATH ROBINSON RED A/P 16FR (CATHETERS) ×1 IMPLANT
DECANTER SPIKE VIAL GLASS SM (MISCELLANEOUS) ×1 IMPLANT
GLOVE SURG ENC MOIS LTX SZ7.5 (GLOVE) ×2 IMPLANT
GLOVE SURG UNDER POLY LF SZ7 (GLOVE) ×2 IMPLANT
GOWN STRL REUS W/ TWL LRG LVL3 (GOWN DISPOSABLE) ×1 IMPLANT
GOWN STRL REUS W/ TWL XL LVL3 (GOWN DISPOSABLE) ×1 IMPLANT
GOWN STRL REUS W/TWL LRG LVL3 (GOWN DISPOSABLE) ×2
GOWN STRL REUS W/TWL XL LVL3 (GOWN DISPOSABLE) ×2
HIBICLENS CHG 4% 4OZ BTL (MISCELLANEOUS) ×1 IMPLANT
KIT BERKELEY 1ST TRI 3/8 NO TR (MISCELLANEOUS) ×2 IMPLANT
KIT BERKELEY 1ST TRIMESTER 3/8 (MISCELLANEOUS) ×2 IMPLANT
NS IRRIG 1000ML POUR BTL (IV SOLUTION) ×2 IMPLANT
PACK VAGINAL MINOR WOMEN LF (CUSTOM PROCEDURE TRAY) ×2 IMPLANT
PAD OB MATERNITY 4.3X12.25 (PERSONAL CARE ITEMS) ×2 IMPLANT
PAD PREP 24X48 CUFFED NSTRL (MISCELLANEOUS) ×2 IMPLANT
SET BERKELEY SUCTION TUBING (SUCTIONS) ×2 IMPLANT
TOWEL GREEN STERILE FF (TOWEL DISPOSABLE) ×4 IMPLANT
VACURETTE 10 RIGID CVD (CANNULA) IMPLANT
VACURETTE 7MM CVD STRL WRAP (CANNULA) IMPLANT
VACURETTE 8 RIGID CVD (CANNULA) ×1 IMPLANT
VACURETTE 9 RIGID CVD (CANNULA) IMPLANT

## 2020-10-30 NOTE — Op Note (Signed)
Sherri Butler PROCEDURE DATE: 10/30/2020  PREOPERATIVE DIAGNOSIS: 8 week missed abortion POSTOPERATIVE DIAGNOSIS: The same PROCEDURE:     Dilation and Evacuation SURGEON:  Dr. Nettie Elm  INDICATIONS: 45 y.o. H4V4259 with MAB at [redacted] weeks gestation, needing surgical completion.  Risks of surgery were discussed with the patient including but not limited to: bleeding which may require transfusion; infection which may require antibiotics; injury to uterus or surrounding organs; need for additional procedures including laparotomy or laparoscopy; possibility of intrauterine scarring which may impair future fertility; and other postoperative/anesthesia complications. Written informed consent was obtained.    FINDINGS:  A 8 week size uterus, moderate amounts of products of conception, specimen sent to pathology.  ANESTHESIA:    Monitored intravenous sedation, paracervical block. INTRAVENOUS FLUIDS: As recorded ESTIMATED BLOOD LOSS: 100 cc SPECIMENS:  Products of conception sent to pathology COMPLICATIONS:  None immediate.  PROCEDURE DETAILS:  The patient received intravenous Doxycycline while in the preoperative area.  She was then taken to the operating room where monitored intravenous sedation was administered and was found to be adequate.  After an adequate timeout was performed, she was placed in the dorsal lithotomy position and examined; then prepped and draped in the sterile manner.   Her bladder was catheterized for an unmeasured amount of clear, yellow urine. A vaginal speculum was then placed in the patient's vagina and a single tooth tenaculum was applied to the anterior lip of the cervix.  A paracervical block using 10 ml of 02.5% Marcaine was administered. The cervix was gently dilated to accommodate a 8 mm suction curette that was gently advanced to the uterine fundus.  The suction device was then activated and curette slowly rotated to clear the uterus of products of conception.  A sharp  curettage was then performed to confirm complete emptying of the uterus. There was minimal bleeding noted and the tenaculum removed with good hemostasis noted.   All instruments were removed from the patient's vagina. 200 mcg of Cytotec was placed rectal at conclusion of the case Sponge and instrument counts were correct times two  The patient tolerated the procedure well and was taken to the recovery area awake, and in stable condition.  The patient will be discharged to home as per PACU criteria.  Routine postoperative instructions given.  She was prescribed Percocet & Ibuprofen.  She will follow up in the clinic  for postoperative evaluation.   Nettie Elm, MD, FACOG Attending Obstetrician & Gynecologist Faculty Practice, [facility]

## 2020-10-30 NOTE — Anesthesia Procedure Notes (Signed)
Procedure Name: LMA Insertion Date/Time: 10/30/2020 2:15 PM Performed by: Ronnette Hila, CRNA Pre-anesthesia Checklist: Patient identified, Emergency Drugs available, Suction available and Patient being monitored Patient Re-evaluated:Patient Re-evaluated prior to induction Oxygen Delivery Method: Circle system utilized Preoxygenation: Pre-oxygenation with 100% oxygen Induction Type: IV induction Ventilation: Mask ventilation without difficulty LMA: LMA inserted LMA Size: 4.0 Number of attempts: 1 Airway Equipment and Method: Bite block Placement Confirmation: positive ETCO2 Tube secured with: Tape Dental Injury: Teeth and Oropharynx as per pre-operative assessment

## 2020-10-30 NOTE — Transfer of Care (Signed)
Immediate Anesthesia Transfer of Care Note  Patient: Sherri Butler  Procedure(s) Performed: DILATATION AND EVACUATION (N/A Uterus)  Patient Location: PACU  Anesthesia Type:General  Level of Consciousness: sedated  Airway & Oxygen Therapy: Patient Spontanous Breathing and Patient connected to face mask oxygen  Post-op Assessment: Report given to RN and Post -op Vital signs reviewed and stable  Post vital signs: Reviewed and stable  Last Vitals:  Vitals Value Taken Time  BP 122/81 10/30/20 1448  Temp    Pulse    Resp 25 10/30/20 1450  SpO2    Vitals shown include unvalidated device data.  Last Pain:  Vitals:   10/30/20 1159  TempSrc: Oral  PainSc: 0-No pain         Complications: No complications documented.

## 2020-10-30 NOTE — Anesthesia Postprocedure Evaluation (Signed)
Anesthesia Post Note  Patient: Sherri Butler  Procedure(s) Performed: DILATATION AND EVACUATION (N/A Uterus)     Patient location during evaluation: PACU Anesthesia Type: General Level of consciousness: awake and alert and oriented Pain management: pain level controlled Vital Signs Assessment: post-procedure vital signs reviewed and stable Respiratory status: spontaneous breathing, nonlabored ventilation and respiratory function stable Cardiovascular status: blood pressure returned to baseline Postop Assessment: no apparent nausea or vomiting Anesthetic complications: no   No complications documented.  Last Vitals:  Vitals:   10/30/20 1448 10/30/20 1500  BP: 122/81 121/82  Pulse:  81  Resp: (!) 27 20  Temp: 36.6 C   SpO2: 99% 95%    Last Pain:  Vitals:   10/30/20 1448  TempSrc:   PainSc: 0-No pain                 Kaylyn Layer

## 2020-10-30 NOTE — Interval H&P Note (Signed)
History and Physical Interval Note:  10/30/2020 12:34 PM  Sherri Butler  has presented today for surgery, with the diagnosis of MISSED AB.  The various methods of treatment have been discussed with the patient and family. After consideration of risks, benefits and other options for treatment, the patient has consented to  Procedure(s): DILATATION AND EVACUATION (N/A) as a surgical intervention.  The patient's history has been reviewed, patient examined, no change in status, stable for surgery.  I have reviewed the patient's chart and labs.  Questions were answered to the patient's satisfaction.     Hermina Staggers

## 2020-10-30 NOTE — Anesthesia Preprocedure Evaluation (Addendum)
Anesthesia Evaluation  Patient identified by MRN, date of birth, ID band Patient awake    Reviewed: Allergy & Precautions, NPO status , Patient's Chart, lab work & pertinent test results  History of Anesthesia Complications Negative for: history of anesthetic complications  Airway Mallampati: II  TM Distance: >3 FB Neck ROM: Full    Dental  (+) Missing,    Pulmonary neg pulmonary ROS,    Pulmonary exam normal        Cardiovascular hypertension, Normal cardiovascular exam     Neuro/Psych negative neurological ROS  negative psych ROS   GI/Hepatic negative GI ROS, Neg liver ROS,   Endo/Other  negative endocrine ROS  Renal/GU negative Renal ROS  negative genitourinary   Musculoskeletal negative musculoskeletal ROS (+)   Abdominal   Peds  Hematology Hgb 11.4   Anesthesia Other Findings Day of surgery medications reviewed with patient.  Reproductive/Obstetrics (+) Pregnancy ([redacted]w[redacted]d GA missed Ab)                            Anesthesia Physical Anesthesia Plan  ASA: II  Anesthesia Plan: General   Post-op Pain Management:    Induction: Intravenous  PONV Risk Score and Plan: 3 and Treatment may vary due to age or medical condition, Ondansetron, Dexamethasone, Midazolam and Scopolamine patch - Pre-op  Airway Management Planned: LMA  Additional Equipment: None  Intra-op Plan:   Post-operative Plan: Extubation in OR  Informed Consent: I have reviewed the patients History and Physical, chart, labs and discussed the procedure including the risks, benefits and alternatives for the proposed anesthesia with the patient or authorized representative who has indicated his/her understanding and acceptance.     Dental advisory given  Plan Discussed with: CRNA  Anesthesia Plan Comments:        Anesthesia Quick Evaluation

## 2020-10-30 NOTE — Discharge Instructions (Signed)
Post Anesthesia Home Care Instructions  Activity: Get plenty of rest for the remainder of the day. A responsible individual must stay with you for 24 hours following the procedure.  For the next 24 hours, DO NOT: -Drive a car -Operate machinery -Drink alcoholic beverages -Take any medication unless instructed by your physician -Make any legal decisions or sign important papers.  Meals: Start with liquid foods such as gelatin or soup. Progress to regular foods as tolerated. Avoid greasy, spicy, heavy foods. If nausea and/or vomiting occur, drink only clear liquids until the nausea and/or vomiting subsides. Call your physician if vomiting continues.  Special Instructions/Symptoms: Your throat may feel dry or sore from the anesthesia or the breathing tube placed in your throat during surgery. If this causes discomfort, gargle with warm salt water. The discomfort should disappear within 24 hours.  If you had a scopolamine patch placed behind your ear for the management of post- operative nausea and/or vomiting:  1. The medication in the patch is effective for 72 hours, after which it should be removed.  Wrap patch in a tissue and discard in the trash. Wash hands thoroughly with soap and water. 2. You may remove the patch earlier than 72 hours if you experience unpleasant side effects which may include dry mouth, dizziness or visual disturbances. 3. Avoid touching the patch. Wash your hands with soap and water after contact with the patch.        Dilation and Curettage or Vacuum Curettage, Care After This sheet gives you information about how to care for yourself after your procedure. Your doctor may also give you more specific instructions. If you have problems or questions, contact your doctor. What can I expect after the procedure? After the procedure, it is common to have:  Mild pain or cramping.  Some bleeding or spotting from the vagina. These may last for up to 2 weeks. Follow  these instructions at home: Medicines  Take over-the-counter and prescription medicines only as told by your doctor. This is very important if you take blood-thinning medicine.  Ask your doctor if the medicine prescribed to you requires you to avoid driving or using machinery. Activity  If you were given a medicine to help you relax (sedative) during your procedure, it can affect you for many hours. Do not drive or use machinery until your doctor says that it is safe.  Rest as told by your doctor.  Do not sit for a long time without moving. Get up to take short walks every 1-2 hours. This is important. Ask for help if you feel weak or unsteady.  Do not lift anything that is heavier than 10 lb (4.5 kg), or the limit that you are told, until your doctor says that it is safe.  Return to your normal activities as told by your doctor. Ask your doctor what activities are safe for you.   Lifestyle For at least 2 weeks, or as long as told by your doctor:  Do not douche.  Do not use tampons.  Do not have sex. General instructions  Wear compression stockings as told by your doctor.  It is up to you to get the results of your procedure. Ask your doctor, or the department that is doing the procedure, when your results will be ready.  Keep all follow-up visits as told by your doctor. This is important. Contact a doctor if:  You have very bad cramps that get worse or do not get better with medicine.  You   have very bad pain in your belly (abdomen).  You cannot drink fluids without vomiting.  You have pain in a different part of your pelvis. The pelvis is the area just above your thighs.  You have fluid from your vagina that smells bad.  You have a rash. Get help right away if:  You are bleeding a lot from your vagina. A lot of bleeding means soaking more than one sanitary pad in 1 hour for 2 hours in a row.  You have a fever that is above 100.4F (38.0C).  Your belly feels very  tender or hard.  You have chest pain.  You have trouble breathing.  You feel dizzy.  You feel light-headed.  You pass out (faint).  You have pain in your neck or shoulder area. These symptoms may be an emergency. Do not wait to see if the symptoms will go away. Get medical help right away. Call your local emergency services (911 in the [place] Do not drive yourself to the hospital. Summary  After your procedure, it is common to have pain or cramping. It is also common to have bleeding or spotting from your vagina.  Rest as told. Do not sit for a long time without moving. Get up to take short walks every 1-2 hours.  Do not lift anything that is heavier than 10 lb (4.5 kg), or the limit that you are told.  Contact your doctor if you have fluid from your vagina that smells bad.  Get help right away if you develop any problems from the procedure. Ask your doctor what problems to watch for. This information is not intended to replace advice given to you by your health care provider. Make sure you discuss any questions you have with your health care provider. Document Revised: 07/18/2019 Document Reviewed: 07/18/2019 Elsevier Patient Education  2021 [employer]vier [employer].  

## 2020-10-31 ENCOUNTER — Encounter (HOSPITAL_BASED_OUTPATIENT_CLINIC_OR_DEPARTMENT_OTHER): Payer: Self-pay | Admitting: Obstetrics and Gynecology

## 2020-10-31 NOTE — Addendum Note (Signed)
Addendum  created 10/31/20 1235 by Stephnie Parlier, Jewel Baize, CRNA   Charge Capture section accepted

## 2020-11-01 LAB — SURGICAL PATHOLOGY

## 2020-11-20 ENCOUNTER — Encounter: Payer: No Typology Code available for payment source | Admitting: Obstetrics and Gynecology

## 2021-04-09 ENCOUNTER — Emergency Department (HOSPITAL_COMMUNITY): Payer: No Typology Code available for payment source

## 2021-04-09 ENCOUNTER — Encounter (HOSPITAL_COMMUNITY): Payer: Self-pay | Admitting: Emergency Medicine

## 2021-04-09 ENCOUNTER — Emergency Department (HOSPITAL_COMMUNITY)
Admission: EM | Admit: 2021-04-09 | Discharge: 2021-04-09 | Disposition: A | Discharge status: Home / Self Care | Payer: No Typology Code available for payment source | Attending: Emergency Medicine | Admitting: Emergency Medicine

## 2021-04-09 DIAGNOSIS — R1030 Lower abdominal pain, unspecified: Secondary | ICD-10-CM | POA: Insufficient documentation

## 2021-04-09 LAB — I-STAT BETA HCG BLOOD, ED (MC, WL, AP ONLY): I-stat hCG, quantitative: <5 m[IU]/mL (ref <5)

## 2021-04-09 LAB — COMPREHENSIVE METABOLIC PANEL
ALT: 13 U/L (ref 0–44)
AST: 16 U/L (ref 15–41)
Albumin: 3.5 g/dL (ref 3.5–5.0)
Alkaline Phosphatase: 57 U/L (ref 38–126)
Anion gap: 9 (ref 5–15)
BUN: 7 mg/dL (ref 6–20)
CO2: 22 mmol/L (ref 22–32)
Calcium: 8.9 mg/dL (ref 8.9–10.3)
Chloride: 106 mmol/L (ref 98–111)
Creatinine, Ser: 0.63 mg/dL (ref 0.44–1.00)
GFR, Estimated: >60 mL/min (ref >60)
Glucose, Bld: 103 mg/dL — ABNORMAL HIGH (ref 70–99)
Potassium: 3.7 mmol/L (ref 3.5–5.1)
Sodium: 137 mmol/L (ref 135–145)
Total Bilirubin: 0.8 mg/dL (ref 0.3–1.2)
Total Protein: 7 g/dL (ref 6.5–8.1)

## 2021-04-09 LAB — LIPASE, BLOOD: Lipase: 22 U/L (ref 11–51)

## 2021-04-09 LAB — URINALYSIS, ROUTINE W REFLEX MICROSCOPIC
Bilirubin Urine: NEGATIVE
Glucose, UA: NEGATIVE mg/dL
Ketones, ur: NEGATIVE mg/dL
Leukocytes,Ua: NEGATIVE
Nitrite: NEGATIVE
Protein, ur: 30 mg/dL — AB
Specific Gravity, Urine: 1.025 (ref 1.005–1.030)
pH: 5 (ref 5.0–8.0)

## 2021-04-09 LAB — CBC
HCT: 39.1 % (ref 36.0–46.0)
Hemoglobin: 12.8 g/dL (ref 12.0–15.0)
MCH: 27.2 pg (ref 26.0–34.0)
MCHC: 32.7 g/dL (ref 30.0–36.0)
MCV: 83 fL (ref 80.0–100.0)
Platelets: 277 10*3/uL (ref 150–400)
RBC: 4.71 MIL/uL (ref 3.87–5.11)
RDW: 14.4 % (ref 11.5–15.5)
WBC: 6.3 10*3/uL (ref 4.0–10.5)
nRBC: 0 % (ref 0.0–0.2)

## 2021-04-09 MED ORDER — MORPHINE SULFATE (PF) 2 MG/ML IV SOLN
2.0000 mg | Freq: Once | INTRAVENOUS | Status: AC
  Administered 2021-04-09: 2 mg via INTRAVENOUS
  Filled 2021-04-09: qty 1

## 2021-04-09 MED ORDER — IOHEXOL 300 MG/ML  SOLN
100.0000 mL | Freq: Once | INTRAMUSCULAR | Status: AC | PRN
  Administered 2021-04-09: 100 mL via INTRAVENOUS

## 2021-04-09 MED ORDER — KETOROLAC TROMETHAMINE 15 MG/ML IJ SOLN
15.0000 mg | Freq: Once | INTRAMUSCULAR | Status: AC
  Administered 2021-04-09: 15 mg via INTRAVENOUS
  Filled 2021-04-09: qty 1

## 2021-04-09 MED ORDER — SODIUM CHLORIDE 0.9 % IV BOLUS
1000.0000 mL | Freq: Once | INTRAVENOUS | Status: AC
  Administered 2021-04-09: 1000 mL via INTRAVENOUS

## 2021-04-09 NOTE — ED Triage Notes (Signed)
Pt endorses lower abd cramp since last night. Denies n/v/d. Hx of miscarriage in May and first menstrual cycle started last night.

## 2021-04-09 NOTE — ED Notes (Signed)
ED Provider at bedside. 

## 2021-04-09 NOTE — Discharge Instructions (Addendum)
As discussed, our gynecology colleague, Dr. Vergie Living or his office will contact you for an appropriate follow-up visit.  Monitor your condition carefully and do not hesitate to return here for concerning changes in your condition.

## 2021-04-09 NOTE — ED Provider Notes (Signed)
Emergency Medicine Provider Triage Evaluation Note  Sherri Butler , a 45 y.o. female  was evaluated in triage.  Pt complains of pain.  Pain started yesterday.  Pain is feels sharp.  Is been constant.  Rates it 10 out of 10.  She started vaginal bleeding yesterday. It has been light. She had a recent miscarriage in May and has not had a period since then.  She took a home pregnancy test that was negative.  She denies any urinary symptoms, nausea, vomiting, constipation, diarrhea, vaginal discharge, fever, chills.  Review of Systems  Positive: Pelvic pain, vaginal bleeding.  Negative: Fever, n/v, constipation, diarrhea, vaginal discharge, dysuria, hematuria  Physical Exam  BP (!) 142/99 (BP Location: Left Arm)   Pulse 76   Temp 98.9 F (37.2 C)   Resp 18   Ht 5\' 6"  (1.676 m)   Wt 96.6 kg   LMP 08/01/2020   SpO2 100%   BMI 34.38 kg/m  Gen:   Awake, no distress   Resp:  Norml effort  MSK:   Moves extremities without difficulty  Other:  Middle pelvic pain TTP. No TTP to left or right pelvic area.   Medical Decision Making  Medically screening exam initiated at 10:23 AM.  Appropriate orders placed.  Liviah Lafever was informed that the remainder of the evaluation will be completed by another provider, this initial triage assessment does not replace that evaluation, and the importance of remaining in the ED until their evaluation is complete.   09/29/2020 04/09/21 1025    06/09/21, MD 04/09/21 1331

## 2021-04-09 NOTE — ED Provider Notes (Signed)
[facility] EM[facility] DEPART[facility] Provider Note   CSN: [id number] Arrival date & time: 04/09/21  1308     History Chief Complaint  Patient presents with   Abdominal Pain    Sherri Butler is a 45 y.o. female.  HPI 45 year old female G5, P4 spontaneous abortion in May presents today complaining of lower abdominal cramping with menstruation.  Patient states she is having her first period since she had the miscarriage in May.  She reports that previously she was on Mirena and her periods are much lighter.  She feels that this is somewhat heavier and crampy her than usual.  She took ibuprofen last night.  She is continued to have pain and cramping this morning but has not taken any medication today.  She asked if she spoke to the nurse last night was instructed to come to the ED.  She follows with OB/GYN but has not seen them since prior to her miscarriage.  She is not currently using any birth control.  She denies any fever, chills, nausea, vomiting, or diarrhea.  She has not taken anything by mouth today.  Patient reports that she has been with her same partner for 15 years and has not had any pelvic infections has not noted any abnormal vaginal discharge     Past Medical History:  Diagnosis Date   Obesity (BMI 35.0-39.9 without comorbidity) 2020   Pregnancy induced hypertension 2016    Patient Active Problem List   Diagnosis Date Noted   [redacted] weeks gestation of pregnancy 10/21/2020   Blighted ovum 10/21/2020   Missed abortion 10/21/2020   Encounter for supervision of high risk multigravida of advanced maternal age, antepartum 10/14/2020   Obesity (BMI 35.0-39.9 without comorbidity) 2020    Past Surgical History:  Procedure Laterality Date   DILATION AND EVACUATION N/A 10/30/2020   Procedure: DILATATION AND EVACUATION;  Surgeon: Chancy Milroy, MD;  Location: [facility];  Service: Gynecology;  Laterality: N/A;   IUD REMOVAL N/A 02/21/2020   Procedure:  INTRAUTERINE DEVICE (IUD) REMOVAL;  Surgeon: Aletha Halim, MD;  Location: [facility];  Service: Gynecology;  Laterality: N/A;   NO PAST SURGERIES       OB History     Gravida  5   Para  4   Term  4   Preterm      AB      Living  4      SAB      IAB      Ectopic      Multiple  0   Live Births  4           History reviewed. No pertinent family history.  Social History   Tobacco Use   Smoking status: Never   Smokeless tobacco: Never  Vaping Use   Vaping Use: Never used  Substance Use Topics   Alcohol use: No   Drug use: No    Home Medications Prior to Admission medications   Medication Sig Start Date End Date Taking? Authorizing Provider  ibuprofen (ADVIL) 800 MG tablet Take 1 tablet (800 mg total) by mouth every 8 (eight) hours as needed. 10/30/20   Chancy Milroy, MD  oxyCODONE (OXY IR/ROXICODONE) 5 MG immediate release tablet Take 1 tablet (5 mg total) by mouth every 6 (six) hours as needed for severe pain. 10/30/20   Chancy Milroy, MD  Prenatal Vit-Fe Fumarate-FA (MULTIVITAMIN-PRENATAL) 27-0.8 MG TABS tablet Take 1 tablet by mouth daily at  12 noon.    [provider]    Allergies    Patient has no known allergies.  Review of Systems   Review of Systems  All other systems reviewed and are negative.  Physical Exam Updated Vital Signs BP (!) 148/95   Pulse 70   Temp 98.9 F (37.2 C)   Resp 15   Ht 1.676 m ($Remove'5\' 6"'bGwCwKQ$ )   Wt 96.6 kg   LMP 08/01/2020   SpO2 100%   BMI 34.38 kg/m   Physical Exam Vitals and nursing note reviewed.  Constitutional:      Appearance: She is well-developed. She is obese.  HENT:     Head: Normocephalic.     Mouth/Throat:     Mouth: Mucous membranes are moist.  Eyes:     Extraocular Movements: Extraocular movements intact.     Pupils: Pupils are equal, round, and reactive to light.  Cardiovascular:     Rate and Rhythm: Normal rate and regular rhythm.     Heart sounds: Normal heart  sounds.  Pulmonary:     Effort: Pulmonary effort is normal.     Breath sounds: Normal breath sounds.  Abdominal:     General: Bowel sounds are increased. There is no distension or abdominal bruit.     Palpations: Abdomen is soft.     Tenderness: There is abdominal tenderness in the right lower quadrant and suprapubic area.  Genitourinary:    Vagina: Tenderness and bleeding present.     Cervix: Normal.     Uterus: Normal.      Adnexa: Right adnexa normal.     Comments: Pelvic exam with vaginal bleeding from os consistent with menstrual cycle Uterus feels enlarged and is tender Skin:    General: Skin is warm and dry.     Capillary Refill: Capillary refill takes less than 2 seconds.  Neurological:     General: No focal deficit present.     Mental Status: She is alert.  Psychiatric:        Mood and Affect: Mood normal.    ED Results / Procedures / Treatments   Labs (all labs ordered are listed, but only abnormal results are displayed) Labs Reviewed  COMPREHENSIVE METABOLIC PANEL - Abnormal; Notable for the following components:      Result Value   Glucose, Bld 103 (*)    All other components within normal limits  URINALYSIS, ROUTINE W REFLEX MICROSCOPIC - Abnormal; Notable for the following components:   Color, Urine AMBER (*)    APPearance HAZY (*)    Hgb urine dipstick MODERATE (*)    Protein, ur 30 (*)    Bacteria, UA FEW (*)    All other components within normal limits  LIPASE, BLOOD  CBC  I-STAT BETA HCG BLOOD, ED ([facility], [facility], [facility] ONLY)    EKG None  Radiology No results found.  Procedures Procedures   Medications Ordered in ED Medications  sodium chloride 0.9 % bolus 1,000 mL (has no administration in time range)  morphine 2 MG/ML injection 2 mg (has no administration in time range)  ketorolac (TORADOL) 15 MG/ML injection 15 mg (has no administration in time range)    ED Course  I have reviewed the triage vital signs and the nursing notes.  Pertinent labs &  imaging results that were available during my care of the patient were reviewed by me and considered in my medical decision making (see chart for details).    MDM Rules/Calculators/A&P  Plan IV fluids, pain medicine, and will obtain CT of abdomen. 45 year old female with crampy lower abdominal pain appears to be menstruating.  Pregnancy test is negative.  Menstruation is normal and normal time due to miscarriage no menstruation for several months.  CBC and c-Met are within normal limits.  Has some tenderness of the right lower quadrant and the suprapubic area.  She has an enlarged uterus on my exam.  Patient has CT ordered.  If this is negative and pain is improved anticipate discharge to home. Discussed care with Dr. Vanita Panda and he has assumed care Final Clinical Impression(s) / ED Diagnoses Final diagnoses:  Lower abdominal pain    Rx / DC Orders ED Discharge Orders     None        Pattricia Boss, MD 04/09/21 1550

## 2021-04-09 NOTE — ED Notes (Signed)
Pt back form US

## 2021-04-09 NOTE — ED Provider Notes (Signed)
Care of the patient assumed at signout.  On my initial evaluation after CT results were available the patient is in no distress, hemodynamically unremarkable.  With CT suggesting ultrasound, this was performed.  8:23 PM I reviewed the patient's ultrasound, discussed her case with her gynecology colleague, Dr. Vergie Living who was previously seen the patient, although about 1 year ago.  Discussing ultrasound results with him results in facilitation of outpatient care.  The office will contact the patient for a visit.  Patient and I discussed findings again, she is amenable to the plan, she remains hemodynamically unremarkable.   Gerhard Munch, MD 04/09/21 2024

## 2021-04-15 ENCOUNTER — Other Ambulatory Visit: Payer: Self-pay

## 2021-04-15 ENCOUNTER — Ambulatory Visit (INDEPENDENT_AMBULATORY_CARE_PROVIDER_SITE_OTHER): Payer: Self-pay | Admitting: Obstetrics and Gynecology

## 2021-04-15 ENCOUNTER — Encounter: Payer: Self-pay | Admitting: Obstetrics and Gynecology

## 2021-04-15 DIAGNOSIS — Z30011 Encounter for initial prescription of contraceptive pills: Secondary | ICD-10-CM

## 2021-04-15 DIAGNOSIS — N858 Other specified noninflammatory disorders of uterus: Secondary | ICD-10-CM

## 2021-04-15 MED ORDER — NORETHIN ACE-ETH ESTRAD-FE 1.5-30 MG-MCG PO TABS: 1.0000 | ORAL_TABLET | Freq: Every day | ORAL | 5 refills | Status: DC

## 2021-04-15 NOTE — Progress Notes (Signed)
CC; uterine mass Subjective:    Patient ID: Sherri Butler, female    DOB: 1975/11/07, 45 y.o.   MRN: [id number]  HPI 45 yo G5P4 seen at ED with first menses after D and E 2/2 missed abortion.  Pt had heavy bleeding with clots and pain.  CT scan and ultrasound revealed a lower uterine mass described as a possible hematoma.  The patient currently has no pain.  She is also interested in birth control going forward.   Review of Systems     Objective:   Physical Exam Vitals:   04/15/21 1600  BP: 130/88  Pulse: 69   SURGICAL PATHOLOGY  CASE: [id number]  PATIENT: Trevor Nogales  Surgical Pathology Report      Clinical History: missed ab (cm)    FINAL MICROSCOPIC DIAGNOSIS:   A. PRODUCTS OF CONCEPTION, DILATATION AND EVACUATION:  -  Hydropic villi with mild trophoblastic hyperplasia  -  See comment   COMMENT:   Based on the tissue examination, early molar gestation is not excluded.  Dr. Luisa Hart reviewed the case and agrees with the above diagnosis.   GROSS DESCRIPTION:   Received fresh is a 4.8 x 4.3 x 0.9 cm aggregate of pink-red to dark red  soft tissue, with grossly identifiable villi.  There are few cystic  structures associated with the villi.  Fetal parts are not identified.  Representative sections in 5 blocks.   CLINICAL DATA:  Mass. Follow-up from CT. History of miscarriage in May.   EXAM: TRANSABDOMINAL AND TRANSVAGINAL ULTRASOUND OF PELVIS   TECHNIQUE: Both transabdominal and transvaginal ultrasound examinations of the pelvis were performed. Transabdominal technique was performed for global imaging of the pelvis including uterus, ovaries, adnexal regions, and pelvic cul-de-sac. It was necessary to proceed with endovaginal exam following the transabdominal exam to visualize the ovaries and endometrium.   COMPARISON:  CT 04/09/2021   FINDINGS: Uterus   Measurements: 15.2 x 4.9 x 7.4 cm = volume: 285 mL. Mass or collection in the lower uterine segment  or endocervical region centrally measures 5.2 x 2.8 x 4.2 cm. This corresponds to the lesion demonstrated at CT. There is heterogeneous hypo and hyperechoic internal architecture. No flow is demonstrated on color flow Doppler images. Appearance is likely to represent a large hematoma. Retained products of conception would also be possible but less likely. Additionally, there is a circumscribed 1.6 cm solid mass in the left anterior myometrium likely representing a small uterine fibroid.   Endometrium   Thickness: 13 mm.  No focal abnormality visualized.   Right ovary   Measurements: 2.9 x 1.7 x 2.1 cm = volume: 5 mL. Normal appearance/no adnexal mass. Flow is demonstrated in the ovary on color flow Doppler imaging.   Left ovary   Measurements: 3.5 x 1.5 x 3.7 cm = volume: 10 mL. Normal appearance/no adnexal mass. Flow is demonstrated in the ovary on color flow Doppler imaging.   Other findings   No abnormal free fluid.   IMPRESSION: Masslike collection in the lower uterine segment or send cervical region corresponding to CT abnormality. The lesion measures 5.2 cm in maximal diameter. No flow is demonstrated within. Heterogeneous echotexture. Lesion is likely to represent a hematoma. Retained products of conception would be an alternative possibility. Gynecological consultation suggested.    CLINICAL DATA:  45 year old female with history of acute onset of nonlocalized abdominal pain.   EXAM: CT ABDOMEN AND PELVIS WITH CONTRAST   TECHNIQUE: Multidetector CT imaging of the abdomen and pelvis was performed using  the standard protocol following bolus administration of intravenous contrast.   CONTRAST:  OMNIPAQUE IOHEXOL 300 MG/ML  SOLN   COMPARISON:  No priors.   FINDINGS: Lower chest: Unremarkable.   Hepatobiliary: No suspicious cystic or solid hepatic lesions. No intra or extrahepatic biliary ductal dilatation. Gallbladder is normal in appearance.    Pancreas: No pancreatic mass. No pancreatic ductal dilatation. No pancreatic or peripancreatic fluid collections or inflammatory changes.   Spleen: Unremarkable.   Adrenals/Urinary Tract: Bilateral kidneys and bilateral adrenal glands are normal in appearance. No hydroureteronephrosis. Urinary bladder is normal in appearance.   Stomach/Bowel: The appearance of the stomach is normal. No pathologic dilatation of small bowel or colon. Normal appendix.   Vascular/Lymphatic: No significant atherosclerotic disease, aneurysm or dissection noted in the abdominal or pelvic vasculature. No lymphadenopathy noted in the abdomen or pelvis.   Reproductive: In the lower uterine segment there is a large ovoid shaped lesion (axial image 66 of series 3 and sagittal image 73 of series 7) measuring approximately 6.2 x 3.4 x 3.7 cm which appears to be within the endometrial cavity. Ovaries are unremarkable in appearance.   Other: No significant volume of ascites.  No pneumoperitoneum.   Musculoskeletal: There are no aggressive appearing lytic or blastic lesions noted in the visualized portions of the skeleton.   IMPRESSION: 1. No definite acute findings are noted in the abdomen or pelvis to account for the patient's symptoms. 2. However, there is a large lesion which appears centered in the endometrial cavity of the lower uterine segment. Whether this represents endometrial neoplasm or a more benign lesions such as a polyp is uncertain. Further evaluation with pelvic ultrasound is strongly recommended.        Assessment & Plan:   1. Uterine mass Mass was read as a hematoma on ultrasound.  Will rescan in 6 weeks and if still present, pt will need hysteroscopy  with sampling and/or D and C.  Pt will call if there is any further heavy bleeding. 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  If lesion is still present at 6 weeks, will get tumor markers, bhcg, before scheduling  hysteroscopy  - US PELVIC COMPLETE WITH TRANSVAGINAL; Future  2. Encounter for BCP (birth control pills) initial prescription Pt desires IUD, but was told this is not prudent with mass still in place, she will start OCP with next menses until the lesion has resolved.  - norethindrone-ethinyl estradiol-iron (LOESTRIN FE 1.5/30) 1.5-30 MG-MCG tablet; Take 1 tablet by mouth daily.  Dispense: 28 tablet; Refill: 5  F/u in 2 months to review repeat u/s findings  I spent 20 minutes dedicated to the care of this patient including previsit review of records, face to face time with the patient discussing CT/u/s findings, plan, disease etiology and post visit testing.   Warden Fillers, MD Faculty Attending, [facility]

## 2021-04-15 NOTE — Progress Notes (Signed)
GYN FU from ED. Bleeding, and pain has decreased.  Denies fever, NV, chills.

## 2021-05-27 ENCOUNTER — Other Ambulatory Visit: Payer: Self-pay

## 2021-05-27 ENCOUNTER — Ambulatory Visit (HOSPITAL_COMMUNITY)
Admission: RE | Admit: 2021-05-27 | Discharge: 2021-05-27 | Disposition: A | Discharge status: Home / Self Care | Payer: Self-pay | Source: Ambulatory Visit | Attending: Obstetrics and Gynecology | Admitting: Obstetrics and Gynecology

## 2021-05-27 DIAGNOSIS — N858 Other specified noninflammatory disorders of uterus: Secondary | ICD-10-CM | POA: Insufficient documentation

## 2021-05-28 ENCOUNTER — Telehealth: Payer: Self-pay

## 2021-05-28 NOTE — Telephone Encounter (Addendum)
Spoke with pt and she is aware that the mass has resolved and she can schedule an appt to get IUD. Recommended pt to schedule during her menses. Pt expressed understanding and stated she will call the office at a later time to have this appt scheduled.   ----- Message from Warden Fillers, MD sent at 05/28/2021  8:52 AM EST ----- Endometrial /endocervical mass has resolved.  Pt can be scheduled for IUD at her leisure.  Would recommend scheduling toward the end of her pack of OCPs as she will likely be on her menses then

## 2021-06-17 ENCOUNTER — Ambulatory Visit: Payer: No Typology Code available for payment source | Admitting: Obstetrics and Gynecology

## 2021-08-06 ENCOUNTER — Other Ambulatory Visit: Payer: Self-pay | Admitting: Obstetrics & Gynecology

## 2021-08-06 DIAGNOSIS — Z1231 Encounter for screening mammogram for malignant neoplasm of breast: Secondary | ICD-10-CM

## 2021-08-21 ENCOUNTER — Other Ambulatory Visit: Payer: Self-pay

## 2021-08-21 ENCOUNTER — Ambulatory Visit
Admission: RE | Admit: 2021-08-21 | Discharge: 2021-08-21 | Disposition: A | Discharge status: Home / Self Care | Payer: No Typology Code available for payment source | Source: Ambulatory Visit | Attending: Obstetrics & Gynecology | Admitting: Obstetrics & Gynecology

## 2021-08-21 DIAGNOSIS — Z1231 Encounter for screening mammogram for malignant neoplasm of breast: Secondary | ICD-10-CM

## 2021-08-25 ENCOUNTER — Other Ambulatory Visit: Payer: Self-pay | Admitting: Obstetrics and Gynecology

## 2021-08-25 DIAGNOSIS — R928 Other abnormal and inconclusive findings on diagnostic imaging of breast: Secondary | ICD-10-CM

## 2021-12-02 ENCOUNTER — Encounter: Payer: Self-pay | Admitting: Internal Medicine

## 2021-12-02 ENCOUNTER — Ambulatory Visit: Payer: Self-pay | Admitting: Internal Medicine

## 2021-12-02 VITALS — BP 136/90 | HR 72 | Resp 12 | Ht 63.0 in | Wt 212.0 lb

## 2021-12-02 DIAGNOSIS — R030 Elevated blood-pressure reading, without diagnosis of hypertension: Secondary | ICD-10-CM

## 2021-12-02 DIAGNOSIS — E669 Obesity, unspecified: Secondary | ICD-10-CM

## 2021-12-02 DIAGNOSIS — Z23 Encounter for immunization: Secondary | ICD-10-CM

## 2021-12-02 DIAGNOSIS — H7293 Unspecified perforation of tympanic membrane, bilateral: Secondary | ICD-10-CM

## 2021-12-02 DIAGNOSIS — Z0000 Encounter for general adult medical examination without abnormal findings: Secondary | ICD-10-CM

## 2021-12-02 DIAGNOSIS — N6310 Unspecified lump in the right breast, unspecified quadrant: Secondary | ICD-10-CM

## 2021-12-02 NOTE — Patient Instructions (Signed)

## 2021-12-02 NOTE — Progress Notes (Unsigned)
Subjective:    Patient ID: Sherri Butler, female   DOB: [birth year], 46 y.o.   MRN: [id number]   HPI  CPE without pap  1.  Pap:  States has had done since last performed here--she believes performed in 2022.  She was seen by [facility] 03/2021 for uterine mass, which turned out to be a hematoma that resolved on follow up 1 month later. Also with 2 fibroids.  2.  Mammogram:  Had mammogram 08/21/21 with concerns for possible mass in right breast.  Admits to not having voicemail set up and also screens calls.  Her daughter can set up her voicemail for her.  Looked in her chart and has diagnostic mammogram and ultrasound of right breast for 12/16/2021 with [facility] clinic visit starting at 12:30 p.m.  No family history of breast cancer.  3.  Osteoprevention:  Does drink 1 cup milk daily and willing to drink 3 cups daily.  She walks 1/2 mile daily.Believes it takes her about 30 minutes.  Always on her feet at [facility].  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   4.  Guaiac Cards/FIT:  Never.    5.  Colonoscopy:  Never.  No family history of colon cancer.    6.  Immunizations:  J & J 2022.  Has not had bivalent booster.  7.  Glucose/Cholesterol:  Cholesterol has been fine in past.  Glucose also fine save for when in pain in Oct 2022.     No Known Allergies  Past Medical History:  Diagnosis Date   Obesity (BMI 35.0-39.9 without comorbidity) 2020   Pregnancy induced hypertension 2016   Past Surgical History:  Procedure Laterality Date   DILATION AND EVACUATION N/A 10/30/2020   Procedure: DILATATION AND EVACUATION;  Surgeon: Hermina Staggers, MD;  Location: [facility];  Service: Gynecology;  Laterality: N/A;   IUD REMOVAL N/A 02/21/2020   Procedure: INTRAUTERINE DEVICE (IUD) REMOVAL;  Surgeon: Sibley Bing, MD;  Location: [facility];  Service: Gynecology;  Laterality: N/A;   NO PAST SURGERIES     Family History  Problem Relation Age of Onset   Breast cancer  Neg Hx    Social History   Socioeconomic History   Marital status: Media planner    Spouse name: Issa   Number of children: 4   Years of education: finished high school in [place]   Highest education level: Not on file  Occupational History   Occupation: Event organiser: [employer]  Tobacco Use   Smoking status: Never    Passive exposure: Never   Smokeless tobacco: Never  Vaping Use   Vaping status: Never Used  Substance and Sexual Activity   Alcohol use: No   Drug use: No   Sexual activity: Yes    Birth control/protection: Implant  Other Topics Concern   Not on file  Social History Narrative   Originally from [place]   Came to [employer]. In 2002   Lives at home with her boyfriend, who is father of youngest child, and her 4 children   Father of other children lives in [place] not provide any support.   Social Determinants of Health   Financial Resource Strain: Low Risk  (12/02/2021)   Overall Financial Resource Strain (CARDIA)    Difficulty of Paying Living Expenses: Not hard at all  Food Insecurity: No Food Insecurity (12/16/2021)   Hunger Vital Sign    Worried About Running Out of Food in the Last Year: Never  true    Ran Out of Food in the Last Year: Never true  Transportation Needs: No Transportation Needs (12/16/2021)   PRAPARE - Administrator, Civil Service (Medical): No    Lack of Transportation (Non-Medical): No  Physical Activity: Insufficiently Active (02/07/2019)   Exercise Vital Sign    Days of Exercise per Week: 5 days    Minutes of Exercise per Session: 20 min  Stress: Not on file  Social Connections: Not on file  Intimate Partner Violence: Not At Risk (12/02/2021)   Humiliation, Afraid, Rape, and Kick questionnaire    Fear of Current or Ex-Partner: No    Emotionally Abused: No    Physically Abused: No    Sexually Abused: No      Review of Systems    Objective:   BP 136/90 (BP Location: Left Arm, Patient Position: Sitting, Cuff  Size: Normal)   Pulse 72   Resp 12   Ht 5\' 3"  (1.6 m)   Wt 212 lb (96.2 kg)   LMP 11/22/2021   Breastfeeding No   BMI 37.55 kg/m   Physical Exam HENT:     Head:     Comments: Right TM with anterior and inferior and posterior-inferior perforations Left TM with inferior performation vs localized retraction Genitourinary:    Comments: Performed in December with [facility]    Assessment & Plan   ***

## 2021-12-16 ENCOUNTER — Ambulatory Visit
Admission: RE | Admit: 2021-12-16 | Discharge: 2021-12-16 | Disposition: A | Discharge status: Home / Self Care | Payer: No Typology Code available for payment source | Source: Ambulatory Visit | Attending: Obstetrics and Gynecology | Admitting: Obstetrics and Gynecology

## 2021-12-16 ENCOUNTER — Other Ambulatory Visit: Payer: Self-pay | Admitting: Obstetrics and Gynecology

## 2021-12-16 ENCOUNTER — Ambulatory Visit: Payer: Self-pay | Admitting: *Deleted

## 2021-12-16 VITALS — BP 124/88 | Wt 210.4 lb

## 2021-12-16 DIAGNOSIS — N6310 Unspecified lump in the right breast, unspecified quadrant: Secondary | ICD-10-CM

## 2021-12-16 DIAGNOSIS — R928 Other abnormal and inconclusive findings on diagnostic imaging of breast: Secondary | ICD-10-CM

## 2021-12-16 DIAGNOSIS — Z1239 Encounter for other screening for malignant neoplasm of breast: Secondary | ICD-10-CM

## 2021-12-16 NOTE — Progress Notes (Signed)
Ms. Jaretssi Kraker is a 46 y.o. female who presents to [facility] clinic today with no complaints. Patient referred to [facility] by the [facility] due to having a screening mammogram completed 08/21/2021 that additional imaging of the right breast is recommended for follow up.   Pap Smear: Pap smear not completed today. Last Pap smear was 02/07/2019 at [facility] clinic and was normal. Per patient has no history of an abnormal Pap smear. Last Pap smear result is available in Epic.   Physical exam: Breasts Breasts symmetrical. No skin abnormalities bilateral breasts. No nipple retraction bilateral breasts. No nipple discharge bilateral breasts. No lymphadenopathy. No lumps palpated bilateral breasts. No complaints of pain or tenderness on exam.      MS DIGITAL SCREENING TOMO BILATERAL  Result Date: 08/22/2021 CLINICAL DATA:  Screening. EXAM: DIGITAL SCREENING BILATERAL MAMMOGRAM WITH TOMOSYNTHESIS AND CAD TECHNIQUE: Bilateral screening digital craniocaudal and mediolateral oblique mammograms were obtained. Bilateral screening digital breast tomosynthesis was performed. The images were evaluated with computer-aided detection. COMPARISON:  Previous exam(s). ACR Breast Density Category b: There are scattered areas of fibroglandular density. FINDINGS: In the right breast, a possible mass warrants further evaluation. In the left breast, no findings suspicious for malignancy. IMPRESSION: Further evaluation is suggested for a possible mass in the right breast. RECOMMENDATION: Diagnostic mammogram and possibly ultrasound of the right breast. (Code:[id number]-44M) The patient will be contacted regarding the findings, and additional imaging will be scheduled. BI-RADS CATEGORY  0: Incomplete. Need additional imaging evaluation and/or prior mammograms for comparison. Electronically Signed   By: Gerome Sam III M.D.   On: 08/22/2021 18:21   MM DIAG BREAST TOMO BILATERAL  Result Date: 01/14/2017 CLINICAL DATA:   Right breast upper outer quadrant area of palpable concern felt clinically. EXAM: 2D DIGITAL DIAGNOSTIC BILATERAL MAMMOGRAM WITH CAD AND ADJUNCT TOMO ULTRASOUND RIGHT BREAST COMPARISON:  Previous exam(s). ACR Breast Density Category b: There are scattered areas of fibroglandular density. FINDINGS: Mammographically, there are no suspicious masses, areas of architectural distortion or microcalcifications in either breast. 7 mm intramammary lymph node is seen in the right breast slightly upper outer quadrant, posterior depth, slightly posterior to the area of palpable concern, as indicated by a palpable BB marker. Mammographic images were processed with CAD. On physical exam, no suspicious masses are palpated. Targeted right breast ultrasound is performed, showing no suspicious masses or shadowing lesions. Incidental note of right breast 10 o'clock 15 cm from the nipple benign 7 mm intramammary lymph node. No sonographic abnormalities are seen in the right medial retroareolar breast, where the patient describes chronic palpable abnormality. IMPRESSION: No mammographic sonographic evidence of malignancy in either breast. RECOMMENDATION: Further management of patient's area of palpable concern in her right breast should be based on clinical grounds. Otherwise, annual screening mammography is recommended at age 51. I have discussed the findings and recommendations with the patient. Results were also provided in writing at the conclusion of the visit. If applicable, a reminder letter will be sent to the patient regarding the next appointment. BI-RADS CATEGORY  2: Benign. Electronically Signed   By: Ted Mcalpine M.D.   On: 01/14/2017 10:04     Pelvic/Bimanual Pap is not indicated today per BCCCP guidelines.   Smoking History: Patient has never smoked.   Patient Navigation: Patient education provided. Access to services provided for patient through [facility] program. Patient provided transportation to and from  [facility] appointment.  Breast and Cervical Cancer Risk Assessment: Patient does not have family history of breast  cancer, known genetic mutations, or radiation treatment to the chest before age 26. Patient does not have history of cervical dysplasia, immunocompromised, or DES exposure in-utero.  Risk Assessment     Risk Scores       12/16/2021   Last edited by: Meryl Dare, CMA   5-year risk: 0.6 %   Lifetime risk: 8 %            A: [facility]CP exam without pap smear No complaints.  P: Referred patient to the [facility] for a right breast diagnostic mammogram per recommendation. Appointment scheduled Tuesday, December 16, 2021 at 1400.  Priscille Heidelberg, RN 12/16/2021 12:43 PM

## 2021-12-16 NOTE — Patient Instructions (Signed)
Explained breast self awareness with Sherri Butler. Patient did not need a Pap smear today due to last Pap smear was 02/07/2019. Let her know [facility] will cover Pap smears every 3 years unless has a history of abnormal Pap smears. Referred patient to the [facility] of [facility] for a right breast diagnostic mammogram per recommendation. Appointment scheduled Tuesday, December 16, 2021 at 1400. Patient aware of appointment and will be there. [name] verbalized understanding.  Mackenize Delgadillo, Kathaleen Maser, RN 12:43 PM

## 2021-12-17 ENCOUNTER — Other Ambulatory Visit: Payer: Self-pay

## 2021-12-17 VITALS — BP 130/88

## 2021-12-17 DIAGNOSIS — Z0000 Encounter for general adult medical examination without abnormal findings: Secondary | ICD-10-CM

## 2021-12-17 DIAGNOSIS — Z0130 Encounter for examination of blood pressure without abnormal findings: Secondary | ICD-10-CM

## 2021-12-17 DIAGNOSIS — Z1211 Encounter for screening for malignant neoplasm of colon: Secondary | ICD-10-CM

## 2021-12-17 LAB — POC FIT TEST STOOL: Fecal Occult Blood: POSITIVE

## 2021-12-18 LAB — COMPREHENSIVE METABOLIC PANEL
ALT: 9 IU/L (ref 0–32)
AST: 12 IU/L (ref 0–40)
Albumin/Globulin Ratio: 1.4 (ref 1.2–2.2)
Albumin: 4 g/dL (ref 3.8–4.8)
Alkaline Phosphatase: 67 IU/L (ref 44–121)
BUN/Creatinine Ratio: 17 (ref 9–23)
BUN: 10 mg/dL (ref 6–24)
Bilirubin Total: 0.3 mg/dL (ref 0.0–1.2)
CO2: 24 mmol/L (ref 20–29)
Calcium: 9 mg/dL (ref 8.7–10.2)
Chloride: 103 mmol/L (ref 96–106)
Creatinine, Ser: 0.6 mg/dL (ref 0.57–1.00)
Globulin, Total: 2.9 g/dL (ref 1.5–4.5)
Glucose: 106 mg/dL — ABNORMAL HIGH (ref 70–99)
Potassium: 3.9 mmol/L (ref 3.5–5.2)
Sodium: 139 mmol/L (ref 134–144)
Total Protein: 6.9 g/dL (ref 6.0–8.5)
eGFR: 114 mL/min/{1.73_m2} (ref >59)

## 2021-12-18 LAB — LIPID PANEL W/O CHOL/HDL RATIO
Cholesterol, Total: 177 mg/dL (ref 100–199)
HDL: 47 mg/dL (ref >39)
LDL Chol Calc (NIH): 120 mg/dL — ABNORMAL HIGH (ref 0–99)
Triglycerides: 53 mg/dL (ref 0–149)
VLDL Cholesterol Cal: 10 mg/dL (ref 5–40)

## 2021-12-18 LAB — CBC WITH DIFFERENTIAL/PLATELET
Basophils Absolute: 0 10*3/uL (ref 0.0–0.2)
Basos: 0 %
EOS (ABSOLUTE): 0.1 10*3/uL (ref 0.0–0.4)
Eos: 1 %
Hematocrit: 38.1 % (ref 34.0–46.6)
Hemoglobin: 12.6 g/dL (ref 11.1–15.9)
Immature Grans (Abs): 0 10*3/uL (ref 0.0–0.1)
Immature Granulocytes: 0 %
Lymphocytes Absolute: 1.8 10*3/uL (ref 0.7–3.1)
Lymphs: 21 %
MCH: 27.5 pg (ref 26.6–33.0)
MCHC: 33.1 g/dL (ref 31.5–35.7)
MCV: 83 fL (ref 79–97)
Monocytes Absolute: 0.5 10*3/uL (ref 0.1–0.9)
Monocytes: 5 %
Neutrophils Absolute: 6.2 10*3/uL (ref 1.4–7.0)
Neutrophils: 73 %
Platelets: 295 10*3/uL (ref 150–450)
RBC: 4.58 x10E6/uL (ref 3.77–5.28)
RDW: 13.4 % (ref 11.7–15.4)
WBC: 8.6 10*3/uL (ref 3.4–10.8)

## 2021-12-18 LAB — HEPATITIS C ANTIBODY: Hep C Virus Ab: NONREACTIVE

## 2021-12-18 NOTE — Progress Notes (Signed)
Patient has been scheduled for bp recheck on 03/20/22. No changes will be made to medication.

## 2022-06-19 ENCOUNTER — Other Ambulatory Visit: Payer: No Typology Code available for payment source

## 2022-07-29 ENCOUNTER — Other Ambulatory Visit: Payer: Self-pay | Admitting: Obstetrics and Gynecology

## 2022-07-29 ENCOUNTER — Ambulatory Visit
Admission: RE | Admit: 2022-07-29 | Discharge: 2022-07-29 | Disposition: A | Discharge status: Home / Self Care | Payer: No Typology Code available for payment source | Source: Ambulatory Visit | Attending: Obstetrics and Gynecology | Admitting: Obstetrics and Gynecology

## 2022-07-29 DIAGNOSIS — N6310 Unspecified lump in the right breast, unspecified quadrant: Secondary | ICD-10-CM

## 2022-08-12 IMAGING — MG MM DIGITAL DIAGNOSTIC UNILAT*R* W/ TOMO W/ CAD
4 series · 4 of 12 positions shown · non-contrast
Comparison: Previous exam(s).

CLINICAL DATA: 45-year-old female recalled from screening mammogram
dated 08/21/2021 for a possible right breast mass.

EXAM:
DIGITAL DIAGNOSTIC UNILATERAL RIGHT MAMMOGRAM WITH TOMOSYNTHESIS AND
CAD; ULTRASOUND RIGHT BREAST LIMITED
TECHNIQUE: Right digital diagnostic mammography and breast tomosynthesis was
performed. The images were evaluated with computer-aided detection.;
Targeted ultrasound examination of the right breast was performed

[R CC synth-2D]
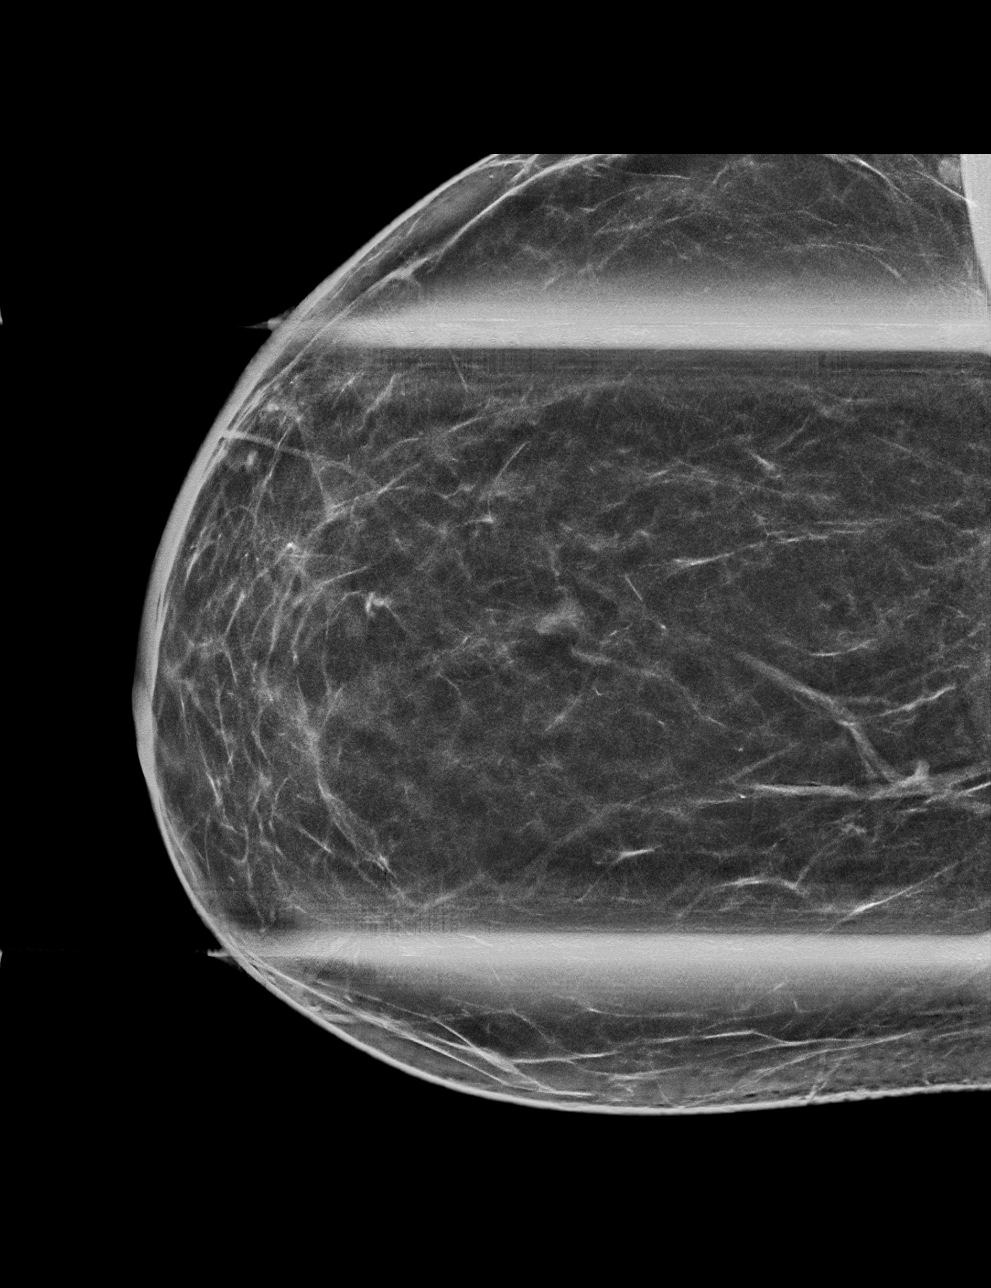

[R MLO synth-2D]
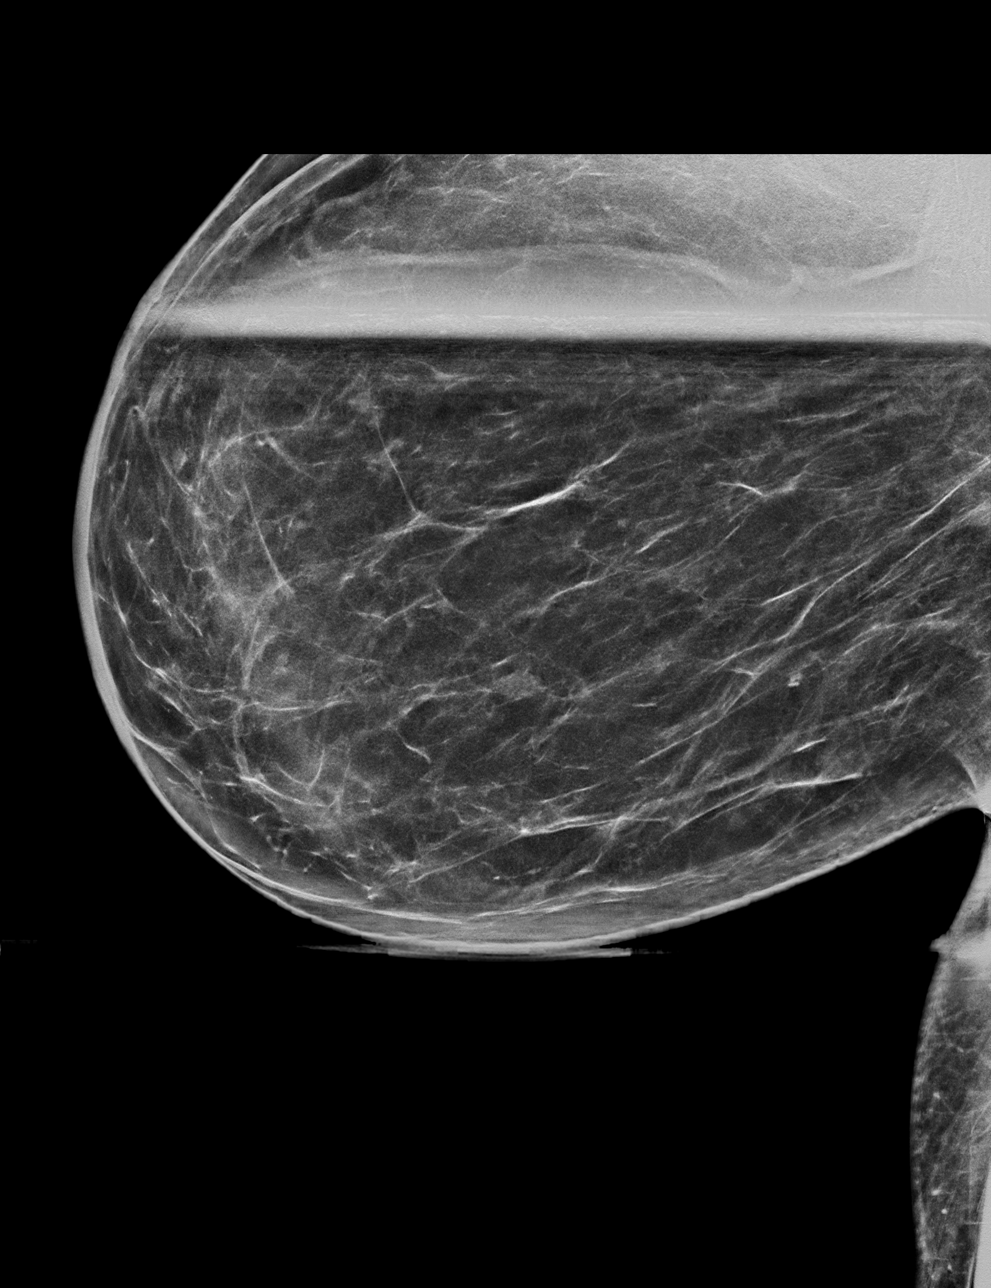

[R CC tomo · tomo slice 33/64.0]
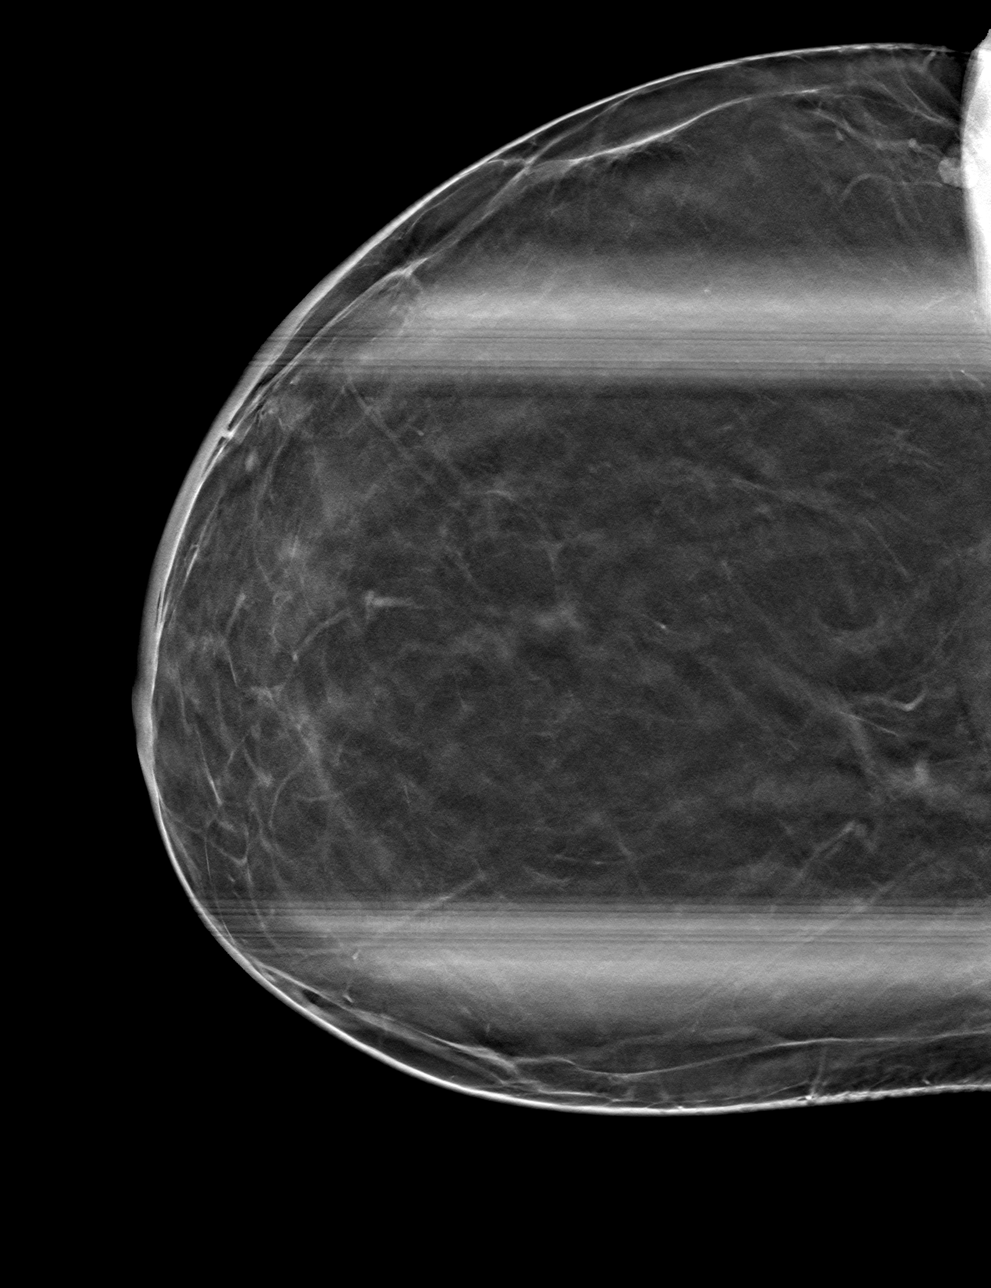

[R MLO tomo · tomo slice 35/69.0]
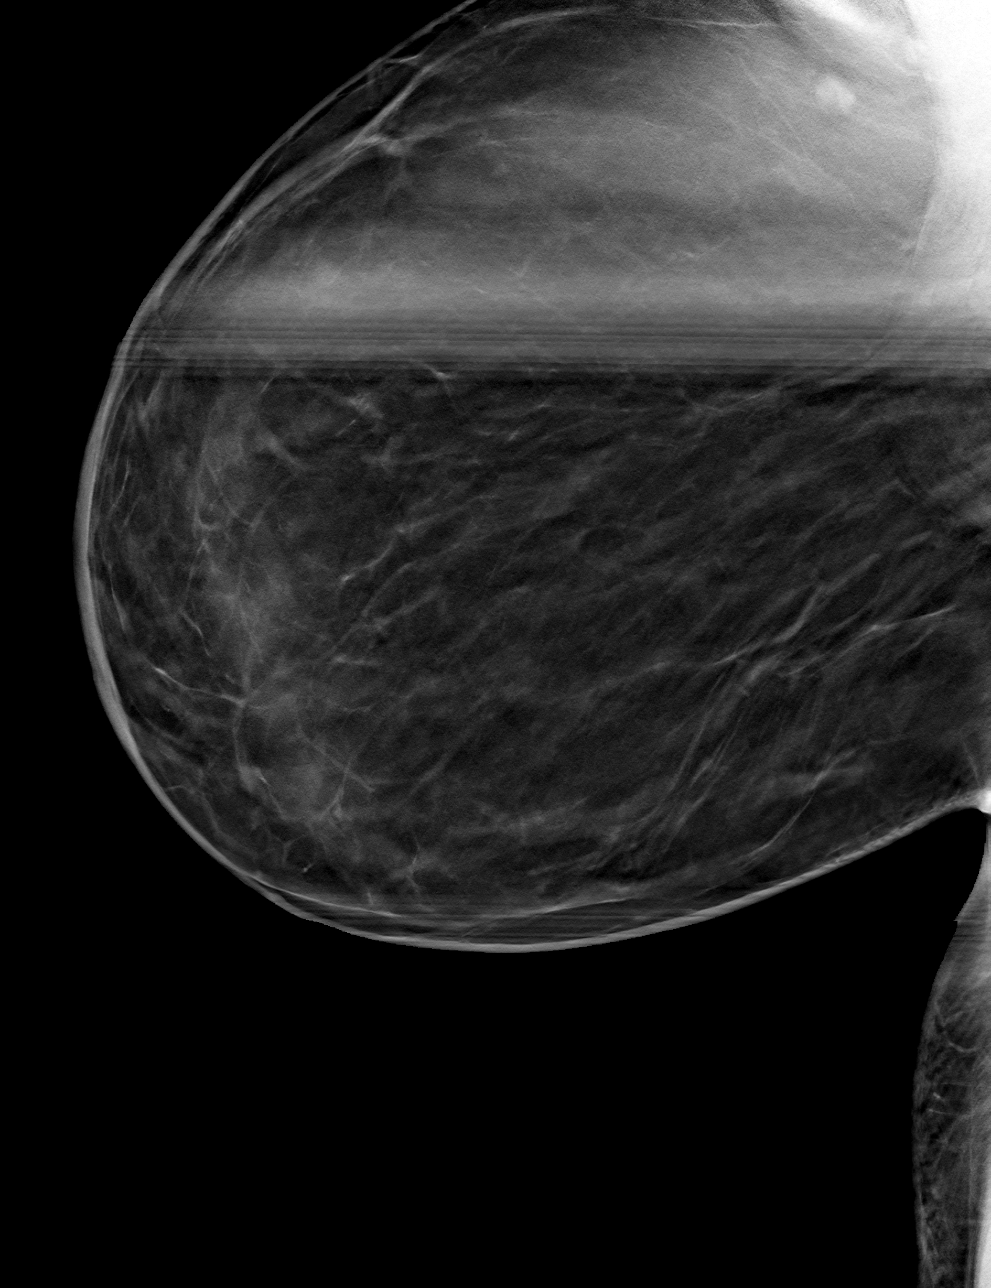

[4 of 12 positions shown; findings below may reference images not displayed]

ACR Breast Density Category b: There are scattered areas of
fibroglandular density.
FINDINGS: There is a persistent, circumscribed mass at the lower outer
quadrant of the right breast at mid depth. Further evaluation with
ultrasound was performed.

Targeted ultrasound is performed, showing an oval, circumscribed
multi-cystic mass at the 7 o'clock position 6 cm from the nipple. It
measures 7 x 6 x 3 mm. There is no internal vascularity. This
correlates well with the mammographic finding.
IMPRESSION: Probably benign right breast mass.  Recommend short-term follow-up.

RECOMMENDATION:
Follow-up right breast ultrasound in 6 months.

I have discussed the findings and recommendations with the patient.
If applicable, a reminder letter will be sent to the patient
regarding the next appointment.

BI-RADS CATEGORY  3: Probably benign.

## 2022-08-24 ENCOUNTER — Ambulatory Visit
Admission: RE | Admit: 2022-08-24 | Discharge: 2022-08-24 | Disposition: A | Discharge status: Home / Self Care | Payer: No Typology Code available for payment source | Source: Ambulatory Visit | Attending: Obstetrics and Gynecology | Admitting: Obstetrics and Gynecology

## 2022-08-24 DIAGNOSIS — N6310 Unspecified lump in the right breast, unspecified quadrant: Secondary | ICD-10-CM

## 2022-12-09 ENCOUNTER — Encounter: Payer: Self-pay | Admitting: Internal Medicine

## 2022-12-22 ENCOUNTER — Other Ambulatory Visit: Payer: Self-pay

## 2022-12-24 ENCOUNTER — Other Ambulatory Visit: Payer: Self-pay

## 2022-12-24 ENCOUNTER — Ambulatory Visit: Payer: Self-pay

## 2022-12-24 DIAGNOSIS — Z01419 Encounter for gynecological examination (general) (routine) without abnormal findings: Secondary | ICD-10-CM

## 2023-04-06 ENCOUNTER — Encounter: Payer: Self-pay | Admitting: Internal Medicine

## 2023-04-06 ENCOUNTER — Ambulatory Visit: Payer: Self-pay | Admitting: Internal Medicine

## 2023-04-06 ENCOUNTER — Other Ambulatory Visit: Payer: Self-pay | Admitting: Internal Medicine

## 2023-04-06 VITALS — BP 142/100 | HR 72 | Resp 16 | Ht 63.5 in | Wt 219.0 lb

## 2023-04-06 DIAGNOSIS — Z0000 Encounter for general adult medical examination without abnormal findings: Secondary | ICD-10-CM

## 2023-04-06 DIAGNOSIS — Z558 Other problems related to education and literacy: Secondary | ICD-10-CM

## 2023-04-06 DIAGNOSIS — N898 Other specified noninflammatory disorders of vagina: Secondary | ICD-10-CM

## 2023-04-06 DIAGNOSIS — H547 Unspecified visual loss: Secondary | ICD-10-CM

## 2023-04-06 DIAGNOSIS — Z9189 Other specified personal risk factors, not elsewhere classified: Secondary | ICD-10-CM

## 2023-04-06 DIAGNOSIS — Z124 Encounter for screening for malignant neoplasm of cervix: Secondary | ICD-10-CM

## 2023-04-06 DIAGNOSIS — Z23 Encounter for immunization: Secondary | ICD-10-CM

## 2023-04-06 DIAGNOSIS — Z603 Acculturation difficulty: Secondary | ICD-10-CM

## 2023-04-06 DIAGNOSIS — D219 Benign neoplasm of connective and other soft tissue, unspecified: Secondary | ICD-10-CM

## 2023-04-06 DIAGNOSIS — N6310 Unspecified lump in the right breast, unspecified quadrant: Secondary | ICD-10-CM

## 2023-04-06 LAB — POCT WET PREP WITH KOH
Clue Cells Wet Prep HPF POC: NEGATIVE
KOH Prep POC: NEGATIVE
RBC Wet Prep HPF POC: NEGATIVE
Trichomonas, UA: NEGATIVE
Yeast Wet Prep HPF POC: NEGATIVE

## 2023-04-06 MED ORDER — CALCIUM CITRATE-VITAMIN D 250-5 MG-MCG PO TABS: ORAL_TABLET | ORAL | Status: AC

## 2023-04-06 NOTE — Progress Notes (Unsigned)
    Subjective:    Patient ID: Sherri Butler, female   DOB: 11/06/1975, 47 y.o.   MRN: [id number]   HPI  CPE with pap  1.  Pap:  Last in 2020 and normal.  Always normal.    2.  Mammogram:  She has what is felt to be a stable benign mass of her right breast.  Was to have a follow up US on 12/22/22, but was canceled.  She was unable to get a new appt.  Had stable findings of right breast mass on 07/2022 and left was normal.  No family history of breast cancer.    3.  Osteoprevention: one serving of yogurt daily.  Willing to take calcium and Vitamin D supplement.  On her feet all day.  Willing to try getting outdoors to walk.    4.  Guaiac Cards/FIT:  Had + FIT in 11/2021.  She cannot recall if she was having a period then or not.  Did not get into GI for colonoscopy  5.  Colonoscopy:  Never.  No family history of colon cancer.    6.  Immunizations:  Has not had influenza or COVID booster vaccine.  7.  Glucose/Cholesterol:  Blood glucose a bit high last year and cholesterol panel acceptable  Lipid Panel     Component Value Date/Time   CHOL 177 12/17/2021 1011   TRIG 53 12/17/2021 1011   HDL 47 12/17/2021 1011   LDLCALC 120 (H) 12/17/2021 1011   LABVLDL 10 12/17/2021 1011     Current Meds  Medication Sig   Ascorbic Acid (VITAMIN C GUMMIE PO) Take by mouth.   ibuprofen (ADVIL) 800 MG tablet Take 1 tablet (800 mg total) by mouth every 8 (eight) hours as needed.   levonorgestrel (MIRENA) 20 MCG/DAY IUD 1 each by Intrauterine route once. December 2022 with [facility].   No Known Allergies   Review of Systems  HENT:  Negative for dental problem.   Eyes:  Positive for visual disturbance (Cannot see far away.  Feels she has vaseline over her eyes for a year.).  Respiratory:  Negative for shortness of breath.   Cardiovascular:  Negative for chest pain, palpitations and leg swelling.  Gastrointestinal:  Negative for abdominal pain and blood in stool (No melena).  Genitourinary:        IUD  [facility] at [facility] family planning in 2022.   Has fibroids  Was having heavy periods Periods are not regular and last varying days.    Psychiatric/Behavioral:  Negative for dysphoric mood. The patient is not nervous/anxious.       Objective:   BP (!) 142/100 (BP Location: Right Arm, Patient Position: Sitting, Cuff Size: Normal)   Pulse 72   Resp 16   Ht 5' 3.5" (1.613 m)   Wt 219 lb (99.3 kg)   BMI 38.19 kg/m   Physical Exam  Small amt yellow thick mucousy discharge IUD strings intact Pelvic normal  uterus mildly retroberted and unable to clearly feel fundus. Assessment & Plan  Look into getting GED if do not have legal papers--referral to SW

## 2023-04-08 LAB — CYTOLOGY - PAP

## 2023-04-09 LAB — GC/CHLAMYDIA PROBE AMP
Chlamydia trachomatis, NAA: NEGATIVE
Neisseria Gonorrhoeae by PCR: NEGATIVE

## 2023-04-23 ENCOUNTER — Other Ambulatory Visit: Payer: Self-pay

## 2023-04-23 DIAGNOSIS — Z0000 Encounter for general adult medical examination without abnormal findings: Secondary | ICD-10-CM

## 2023-04-24 LAB — COMPREHENSIVE METABOLIC PANEL
ALT: 13 [IU]/L (ref 0–32)
AST: 13 [IU]/L (ref 0–40)
Albumin: 3.9 g/dL (ref 3.9–4.9)
Alkaline Phosphatase: 68 [IU]/L (ref 44–121)
BUN/Creatinine Ratio: 16 (ref 9–23)
BUN: 10 mg/dL (ref 6–24)
Bilirubin Total: 0.4 mg/dL (ref 0.0–1.2)
CO2: 22 mmol/L (ref 20–29)
Calcium: 9 mg/dL (ref 8.7–10.2)
Chloride: 104 mmol/L (ref 96–106)
Creatinine, Ser: 0.64 mg/dL (ref 0.57–1.00)
Globulin, Total: 2.9 g/dL (ref 1.5–4.5)
Glucose: 92 mg/dL (ref 70–99)
Potassium: 4.1 mmol/L (ref 3.5–5.2)
Sodium: 139 mmol/L (ref 134–144)
Total Protein: 6.8 g/dL (ref 6.0–8.5)
eGFR: 111 mL/min/{1.73_m2} (ref >59)

## 2023-04-24 LAB — CBC WITH DIFFERENTIAL/PLATELET
Basophils Absolute: 0 10*3/uL (ref 0.0–0.2)
Basos: 0 %
EOS (ABSOLUTE): 0.1 10*3/uL (ref 0.0–0.4)
Eos: 2 %
Hematocrit: 38.5 % (ref 34.0–46.6)
Hemoglobin: 12.7 g/dL (ref 11.1–15.9)
Immature Grans (Abs): 0 10*3/uL (ref 0.0–0.1)
Immature Granulocytes: 0 %
Lymphocytes Absolute: 2 10*3/uL (ref 0.7–3.1)
Lymphs: 38 %
MCH: 27.5 pg (ref 26.6–33.0)
MCHC: 33 g/dL (ref 31.5–35.7)
MCV: 83 fL (ref 79–97)
Monocytes Absolute: 0.4 10*3/uL (ref 0.1–0.9)
Monocytes: 8 %
Neutrophils Absolute: 2.8 10*3/uL (ref 1.4–7.0)
Neutrophils: 52 %
Platelets: 281 10*3/uL (ref 150–450)
RBC: 4.62 x10E6/uL (ref 3.77–5.28)
RDW: 13.1 % (ref 11.7–15.4)
WBC: 5.4 10*3/uL (ref 3.4–10.8)

## 2023-04-24 LAB — LIPID PANEL W/O CHOL/HDL RATIO
Cholesterol, Total: 181 mg/dL (ref 100–199)
HDL: 50 mg/dL (ref >39)
LDL Chol Calc (NIH): 122 mg/dL — ABNORMAL HIGH (ref 0–99)
Triglycerides: 45 mg/dL (ref 0–149)
VLDL Cholesterol Cal: 9 mg/dL (ref 5–40)

## 2023-04-24 LAB — HEMOGLOBIN A1C
Est. average glucose Bld gHb Est-mCnc: 117 mg/dL
Hgb A1c MFr Bld: 5.7 % — ABNORMAL HIGH (ref 4.8–5.6)

## 2023-07-09 DIAGNOSIS — Z603 Acculturation difficulty: Secondary | ICD-10-CM | POA: Insufficient documentation

## 2023-07-13 ENCOUNTER — Other Ambulatory Visit: Payer: Self-pay

## 2023-07-13 ENCOUNTER — Telehealth: Payer: Self-pay

## 2023-07-13 DIAGNOSIS — N6313 Unspecified lump in the right breast, lower outer quadrant: Secondary | ICD-10-CM

## 2023-07-13 NOTE — Telephone Encounter (Signed)
 Pt is being seen in [facility] 07/15/23. Pt was due to have her RIGHT breast ultrasound 12/22/22 but did not complete this. It appears she was therefore scheduled for the US  on 07/15/23. Mammo report dated 08/24/22 indicates that pt will need a BILATERAL dx mammogram after 08/24/22.  I have called and spoken with the pt to advise of this. She expressed she would prefer to have the dx mammo and US  on the same day to minimize her need to take off of work. I have provided the pt with the contact number to the breast center to reschedule the US  and have it coupled with her dx mammo appt on or after 08/25/22.  Pt understands she still needs to be seen in [facility] on Thursday 07/15/23 to renew her expired pink card. Pt was provided the address for the [facility] and advised to present at 10:15am to register. Pt was also advised to come to the 2nd floor and ring the black doorbell once she arrives. Pt expressed understanding of all this information.  I have also entered orders for dx bilat mammo and right breast US .

## 2023-07-15 ENCOUNTER — Ambulatory Visit: Payer: Self-pay | Admitting: *Deleted

## 2023-07-15 ENCOUNTER — Other Ambulatory Visit: Payer: Self-pay

## 2023-07-15 ENCOUNTER — Ambulatory Visit: Admission: RE | Admit: 2023-07-15 | Payer: Self-pay | Source: Ambulatory Visit

## 2023-07-15 VITALS — BP 146/99 | Wt 226.0 lb

## 2023-07-15 DIAGNOSIS — Z1239 Encounter for other screening for malignant neoplasm of breast: Secondary | ICD-10-CM

## 2023-07-15 DIAGNOSIS — Z1211 Encounter for screening for malignant neoplasm of colon: Secondary | ICD-10-CM

## 2023-07-15 NOTE — Patient Instructions (Signed)
Explained breast self awareness with Chelsye Karner. Patient did not need a Pap smear today due to last Pap smear was 04/06/2023. Let her know [facility]CP will cover Pap smears every 3 years unless has a history of abnormal Pap smears. Referred patient to the [facility] of [facility] for a diagnostic mammogram per recommendation. Appointment scheduled Friday, August 27, 2023 at 1400. Patient aware of appointment and will be there. Cyla Alamillo verbalized understanding.  Seann Genther, Kathaleen Maser, RN 10:56 AM

## 2023-07-15 NOTE — Progress Notes (Signed)
[name] is a 48 y.o. female who presents to [facility] clinic today with no complaints. Patient referred to [facility] due to bilateral diagnostic mammogram being recommended in one year. Last diagnostic mammogram was completed 08/24/2022.   Pap Smear: Pap smear not completed today. Last Pap smear was 04/06/2023 at [facility] clinic and was normal. Per patient has no history of an abnormal Pap smear. Last Pap smear result is available in Epic.   Physical exam: Breasts Breasts symmetrical. No skin abnormalities bilateral breasts. No nipple retraction bilateral breasts. No nipple discharge bilateral breasts. No lymphadenopathy. No lumps palpated bilateral breasts. No complaints of pain or tenderness on exam.     MM DIAG BREAST TOMO BILATERAL Result Date: 08/24/2022 CLINICAL DATA:  Follow-up right breast probably benign mass. EXAM: DIGITAL DIAGNOSTIC BILATERAL MAMMOGRAM WITH TOMOSYNTHESIS TECHNIQUE: Bilateral digital diagnostic mammography and breast tomosynthesis was performed. COMPARISON:  Previous exam(s). ACR Breast Density Category b: There are scattered areas of fibroglandular density. FINDINGS: The previously described probably benign mass in the 7 o'clock position of the right breast is unchanged since the right diagnostic mammogram dated 12/16/2021. This was also stable on a right breast ultrasound on 07/29/2022 with follow-up right breast ultrasound recommended in June 2024 and scheduled on 12/22/2022. No interval findings suspicious for malignancy in either breast. IMPRESSION: 1. Stable right breast probably benign mass. 2. No evidence of malignancy elsewhere in either breast. RECOMMENDATION: 1. The previously recommended follow-up right breast ultrasound scheduled on 12/22/2022. 2. Bilateral diagnostic mammogram in 1 year. I have discussed the findings and recommendations with the patient. If applicable, a reminder letter will be sent to the patient regarding the next appointment. BI-RADS CATEGORY   3: Probably benign. Electronically Signed   By: Beckie Salts M.D.   On: 08/24/2022 17:10  MS DIGITAL DIAG TOMO UNI RIGHT Result Date: 12/16/2021 CLINICAL DATA:  48 year old female recalled from screening mammogram dated 08/21/2021 for a possible right breast mass. EXAM: DIGITAL DIAGNOSTIC UNILATERAL RIGHT MAMMOGRAM WITH TOMOSYNTHESIS AND CAD; ULTRASOUND RIGHT BREAST LIMITED TECHNIQUE: Right digital diagnostic mammography and breast tomosynthesis was performed. The images were evaluated with computer-aided detection.; Targeted ultrasound examination of the right breast was performed COMPARISON:  Previous exam(s). ACR Breast Density Category b: There are scattered areas of fibroglandular density. FINDINGS: There is a persistent, circumscribed mass at the lower outer quadrant of the right breast at mid depth. Further evaluation with ultrasound was performed. Targeted ultrasound is performed, showing an oval, circumscribed multi-cystic mass at the 7 o'clock position 6 cm from the nipple. It measures 7 x 6 x 3 mm. There is no internal vascularity. This correlates well with the mammographic finding. IMPRESSION: Probably benign right breast mass.  Recommend short-term follow-up. RECOMMENDATION: Follow-up right breast ultrasound in 6 months. I have discussed the findings and recommendations with the patient. If applicable, a reminder letter will be sent to the patient regarding the next appointment. BI-RADS CATEGORY  3: Probably benign. Electronically Signed   By: Sande Brothers M.D.   On: 12/16/2021 14:58  MS DIGITAL SCREENING TOMO BILATERAL Result Date: 08/22/2021 CLINICAL DATA:  Screening. EXAM: DIGITAL SCREENING BILATERAL MAMMOGRAM WITH TOMOSYNTHESIS AND CAD TECHNIQUE: Bilateral screening digital craniocaudal and mediolateral oblique mammograms were obtained. Bilateral screening digital breast tomosynthesis was performed. The images were evaluated with computer-aided detection. COMPARISON:  Previous exam(s). ACR  Breast Density Category b: There are scattered areas of fibroglandular density. FINDINGS: In the right breast, a possible mass warrants further evaluation. In the left breast, no findings suspicious  for malignancy. IMPRESSION: Further evaluation is suggested for a possible mass in the right breast. RECOMMENDATION: Diagnostic mammogram and possibly ultrasound of the right breast. (Code:[id number]) The patient will be contacted regarding the findings, and additional imaging will be scheduled. BI-RADS CATEGORY  0: Incomplete. Need additional imaging evaluation and/or prior mammograms for comparison. Electronically Signed   By: Gerome Sam III M.D.   On: 08/22/2021 18:21   Pelvic/Bimanual Pap is not indicated today per BCCCP guidelines.   Smoking History: Patient has never smoked.   Patient Navigation: Patient education provided. Access to services provided for patient through [facility] program.   Colorectal Cancer Screening: Per patient has never had colonoscopy completed. Patient stated she was given a FIT Test by her PCP Dr. Delrae Alfred in October 2024. No complaints today.    Breast and Cervical Cancer Risk Assessment: Patient does not have family history of breast cancer, known genetic mutations, or radiation treatment to the chest before age 33. Patient does not have history of cervical dysplasia, immunocompromised, or DES exposure in-utero.  Risk Scores as of Encounter on 07/15/2023     Sherri Butler           5-year 0.67%   Lifetime 7.2%            Last calculated by Caprice Red, CMA on 07/15/2023 at 10:47 AM        A: [facility]CP exam without pap smear No complaints.  P: Referred patient to the [facility] of [facility] for a diagnostic mammogram per recommendation. Appointment scheduled Friday, August 27, 2023 at 1400.  Priscille Heidelberg, RN 07/15/2023 10:56 AM

## 2023-08-27 ENCOUNTER — Ambulatory Visit
Admission: RE | Admit: 2023-08-27 | Discharge: 2023-08-27 | Disposition: A | Discharge status: Home / Self Care | Payer: No Typology Code available for payment source | Source: Ambulatory Visit | Attending: Obstetrics and Gynecology | Admitting: Obstetrics and Gynecology

## 2023-08-27 ENCOUNTER — Ambulatory Visit
Admission: RE | Admit: 2023-08-27 | Discharge: 2023-08-27 | Disposition: A | Discharge status: Home / Self Care | Payer: Self-pay | Source: Ambulatory Visit | Attending: Obstetrics and Gynecology | Admitting: Obstetrics and Gynecology

## 2023-08-27 DIAGNOSIS — N6313 Unspecified lump in the right breast, lower outer quadrant: Secondary | ICD-10-CM

## 2024-03-26 ENCOUNTER — Encounter (HOSPITAL_COMMUNITY): Payer: Self-pay

## 2024-03-26 ENCOUNTER — Emergency Department (HOSPITAL_COMMUNITY)
Admission: EM | Admit: 2024-03-26 | Discharge: 2024-03-26 | Disposition: A | Discharge status: Home / Self Care | Attending: Emergency Medicine | Admitting: Emergency Medicine

## 2024-03-26 ENCOUNTER — Other Ambulatory Visit: Payer: Self-pay

## 2024-03-26 ENCOUNTER — Emergency Department (HOSPITAL_COMMUNITY)

## 2024-03-26 DIAGNOSIS — N12 Tubulo-interstitial nephritis, not specified as acute or chronic: Secondary | ICD-10-CM

## 2024-03-26 DIAGNOSIS — D219 Benign neoplasm of connective and other soft tissue, unspecified: Secondary | ICD-10-CM | POA: Insufficient documentation

## 2024-03-26 HISTORY — DX: Tubulo-interstitial nephritis, not specified as acute or chronic: N12

## 2024-03-26 LAB — URINALYSIS, ROUTINE W REFLEX MICROSCOPIC
Bilirubin Urine: NEGATIVE
Glucose, UA: NEGATIVE mg/dL
Ketones, ur: NEGATIVE mg/dL
Nitrite: NEGATIVE
Protein, ur: NEGATIVE mg/dL
Specific Gravity, Urine: 1.006 (ref 1.005–1.030)
pH: 6 (ref 5.0–8.0)

## 2024-03-26 LAB — CBC
HCT: 40.4 % (ref 36.0–46.0)
Hemoglobin: 13.4 g/dL (ref 12.0–15.0)
MCH: 27.6 pg (ref 26.0–34.0)
MCHC: 33.2 g/dL (ref 30.0–36.0)
MCV: 83.3 fL (ref 80.0–100.0)
Platelets: 256 K/uL (ref 150–400)
RBC: 4.85 MIL/uL (ref 3.87–5.11)
RDW: 14.1 % (ref 11.5–15.5)
WBC: 15.3 K/uL — ABNORMAL HIGH (ref 4.0–10.5)
nRBC: 0 % (ref 0.0–0.2)

## 2024-03-26 LAB — COMPREHENSIVE METABOLIC PANEL WITH GFR
ALT: 24 U/L (ref 0–44)
AST: 27 U/L (ref 15–41)
Albumin: 4 g/dL (ref 3.5–5.0)
Alkaline Phosphatase: 87 U/L (ref 38–126)
Anion gap: 13 (ref 5–15)
BUN: 7 mg/dL (ref 6–20)
CO2: 19 mmol/L — ABNORMAL LOW (ref 22–32)
Calcium: 8.9 mg/dL (ref 8.9–10.3)
Chloride: 97 mmol/L — ABNORMAL LOW (ref 98–111)
Creatinine, Ser: 0.65 mg/dL (ref 0.44–1.00)
GFR, Estimated: >60 mL/min (ref >60)
Glucose, Bld: 141 mg/dL — ABNORMAL HIGH (ref 70–99)
Potassium: 3.5 mmol/L (ref 3.5–5.1)
Sodium: 130 mmol/L — ABNORMAL LOW (ref 135–145)
Total Bilirubin: 0.7 mg/dL (ref 0.0–1.2)
Total Protein: 7.7 g/dL (ref 6.5–8.1)

## 2024-03-26 LAB — LIPASE, BLOOD: Lipase: 14 U/L (ref 11–51)

## 2024-03-26 LAB — RESP PANEL BY RT-PCR (RSV, FLU A&B, COVID)  RVPGX2
Influenza A by PCR: NEGATIVE
Influenza B by PCR: NEGATIVE
Resp Syncytial Virus by PCR: NEGATIVE
SARS Coronavirus 2 by RT PCR: NEGATIVE

## 2024-03-26 LAB — HCG, SERUM, QUALITATIVE: Preg, Serum: NEGATIVE

## 2024-03-26 MED ORDER — SODIUM CHLORIDE 0.9 % IV SOLN
2.0000 g | Freq: Once | INTRAVENOUS | Status: AC
  Administered 2024-03-26: 2 g via INTRAVENOUS
  Filled 2024-03-26: qty 20

## 2024-03-26 MED ORDER — SULFAMETHOXAZOLE-TRIMETHOPRIM 800-160 MG PO TABS: 1.0000 | ORAL_TABLET | Freq: Two times a day (BID) | ORAL | 0 refills | Status: AC

## 2024-03-26 MED ORDER — IOHEXOL 300 MG/ML  SOLN
100.0000 mL | Freq: Once | INTRAMUSCULAR | Status: AC | PRN
  Administered 2024-03-26: 100 mL via INTRAVENOUS

## 2024-03-26 MED ORDER — OXYCODONE-ACETAMINOPHEN 5-325 MG PO TABS
1.0000 | ORAL_TABLET | Freq: Once | ORAL | Status: AC
  Administered 2024-03-26: 1 via ORAL
  Filled 2024-03-26: qty 1

## 2024-03-26 MED ORDER — ONDANSETRON 8 MG PO TBDP: 8.0000 mg | ORAL_TABLET | Freq: Three times a day (TID) | ORAL | 0 refills | Status: AC | PRN

## 2024-03-26 MED ORDER — ONDANSETRON 4 MG PO TBDP
4.0000 mg | ORAL_TABLET | Freq: Once | ORAL | Status: AC
  Administered 2024-03-26: 4 mg via ORAL
  Filled 2024-03-26: qty 1

## 2024-03-26 MED ORDER — ACETAMINOPHEN 325 MG PO TABS
650.0000 mg | ORAL_TABLET | Freq: Once | ORAL | Status: AC
  Administered 2024-03-26: 650 mg via ORAL
  Filled 2024-03-26: qty 2

## 2024-03-26 NOTE — ED Triage Notes (Addendum)
 C/o lower mid abdominal pain, nausea, feeling warm, and headache. Some dizziness. Symptoms started Friday. Took tylenol  and OTC flu meds. Took 325mg  of tylenol  at 1000.

## 2024-03-26 NOTE — Discharge Instructions (Addendum)
 You are seen in the emergency room for pain. The workup in the emergency room revealed that you have kidney infection.  Please start taking the antibiotics that are prescribed.  Take nausea medication as needed for nausea.  Hydrate well.    Please return to the ER if your symptoms worsen; you have increased pain, fevers, chills, inability to keep any medications down, confusion. Otherwise see the outpatient doctor as requested.

## 2024-03-26 NOTE — ED Notes (Signed)
 Pt was unable to urinate. RN informed.

## 2024-03-26 NOTE — ED Notes (Signed)
 Ambulated pt to restroom to give urine sample.

## 2024-03-26 NOTE — ED Provider Notes (Signed)
 [place] EMERGENCY DEPARTMENT AT [facility] Provider Note   CSN: [id number] Arrival date & time: 03/26/24  1251     Patient presents with: Abdominal Pain   Sherri Butler is a 48 y.o. female.   HPI     48 y/o patient comes in with cc of abdominal pain. Patient has history of elevated BMI, uterine fibroids.  She comes to the ER because of feeling sick since Friday.  Patient has lower mid abdominal pain, nausea, vomiting and she has some cough, URI-like symptoms, headache.  Patient has taken over-the-counter medications without relief.  She has mild discomfort with urination, but denies any vaginal discharge, bleeding, urinary frequency.  She denies any sick contacts.  Prior to Admission medications   Medication Sig Start Date End Date Taking? Authorizing Provider  ondansetron  (ZOFRAN -ODT) 8 MG disintegrating tablet Take 1 tablet (8 mg total) by mouth every 8 (eight) hours as needed for nausea. 03/26/24  Yes Jonahtan Manseau, MD  sulfamethoxazole-trimethoprim (BACTRIM DS) 800-160 MG tablet Take 1 tablet by mouth 2 (two) times daily for 14 days. 03/26/24 04/09/24 Yes Charlyn Sora, MD  Ascorbic Acid (VITAMIN C GUMMIE PO) Take by mouth.    [provider]  Calcium  Citrate-Vitamin D  250-5 MG-MCG TABS 500 mg of calcium  citrate twice daily 04/06/23   Adella Norris, MD  ibuprofen  (ADVIL ) 800 MG tablet Take 1 tablet (800 mg total) by mouth every 8 (eight) hours as needed. 10/30/20   Ervin, Michael L, MD  levonorgestrel (MIRENA) 20 MCG/DAY IUD 1 each by Intrauterine route once. December 2022 with [facility].    [provider]    Allergies: Patient has no known allergies.    Review of Systems  All other systems reviewed and are negative.   Updated Vital Signs BP 102/64 (BP Location: Left Arm)   Pulse 95   Temp 99.3 F (37.4 C) (Oral)   Resp 18   SpO2 97%   Physical Exam Vitals and nursing note reviewed.  HENT:     Head: Atraumatic.  Eyes:     Pupils:  Pupils are equal, round, and reactive to light.  Neck:     Comments: No midline c-spine tenderness Cardiovascular:     Rate and Rhythm: Normal rate.  Pulmonary:     Effort: Pulmonary effort is normal. No respiratory distress.     Breath sounds: Normal breath sounds.  Chest:     Chest wall: No tenderness.  Abdominal:     General: Bowel sounds are normal. There is no distension.     Palpations: Abdomen is soft.     Tenderness: There is abdominal tenderness in the right lower quadrant, suprapubic area and left lower quadrant.  Musculoskeletal:        General: No deformity.     Cervical back: Neck supple. No tenderness.     Comments: No long bone tenderness - upper and lower extrmeities and no pelvic pain, instability.  Skin:    General: Skin is warm and dry.     Findings: No rash.  Neurological:     Mental Status: She is alert and oriented to person, place, and time.     Cranial Nerves: No cranial nerve deficit.     (all labs ordered are listed, but only abnormal results are displayed) Labs Reviewed  COMPREHENSIVE METABOLIC PANEL WITH GFR - Abnormal; Notable for the following components:      Result Value   Sodium 130 (*)    Chloride 97 (*)    CO2  19 (*)    Glucose, Bld 141 (*)    All other components within normal limits  CBC - Abnormal; Notable for the following components:   WBC 15.3 (*)    All other components within normal limits  URINALYSIS, ROUTINE W REFLEX MICROSCOPIC - Abnormal; Notable for the following components:   Hgb urine dipstick SMALL (*)    Leukocytes,Ua SMALL (*)    Bacteria, UA MANY (*)    All other components within normal limits  RESP PANEL BY RT-PCR (RSV, FLU A&B, COVID)  [id number]  URINE CULTURE  LIPASE, BLOOD  HCG, SERUM, QUALITATIVE    EKG: None  Radiology: CT ABDOMEN PELVIS W CONTRAST Result Date: 03/26/2024 CLINICAL DATA:  Left lower quadrant pain EXAM: CT ABDOMEN AND PELVIS WITH CONTRAST TECHNIQUE: Multidetector CT imaging of the abdomen  and pelvis was performed using the standard protocol following bolus administration of intravenous contrast. RADIATION DOSE REDUCTION: This exam was performed according to the departmental dose-optimization program which includes automated exposure control, adjustment of the mA and/or kV according to patient size and/or use of iterative reconstruction technique. CONTRAST:  OMNIPAQUE  IOHEXOL  300 MG/ML  SOLN COMPARISON:  CT 04/09/2021 FINDINGS: Lower chest: Lung bases demonstrate no acute airspace disease. Hepatobiliary: No focal liver abnormality is seen. No gallstones, gallbladder wall thickening, or biliary dilatation. Pancreas: Unremarkable. No pancreatic ductal dilatation or surrounding inflammatory changes. Spleen: Normal in size without focal abnormality. Adrenals/Urinary Tract: Adrenal glands are normal. Mild cortical scarring in the kidneys. No hydronephrosis. Multifocal wedge-shaped hypodensities within the left kidney with mild left perinephric stranding. The bladder is decompressed. Stomach/Bowel: Stomach within normal limits. No dilated small bowel. No acute bowel wall thickening. Negative appendix Vascular/Lymphatic: No significant vascular findings are present. No enlarged abdominal or pelvic lymph nodes. Reproductive: IUD in the uterus. No suspicious adnexal mass. Hypodense mass within the anterior uterine fundus measuring 3.2 cm presumably represents a fibroid. Other: Negative for pelvic effusion or free air Musculoskeletal: No acute or suspicious osseous abnormality. IMPRESSION: 1. Multifocal wedge-shaped hypodensities within the left kidney with mild left perinephric stranding, findings are suspicious for pyelonephritis. No hydronephrosis. 2. 3.2 cm hypodense mass within the anterior uterine fundus presumably represents a fibroid. Electronically Signed   By: Luke Bun M.D.   On: 03/26/2024 17:16     Procedures   Medications Ordered in the ED  cefTRIAXone (ROCEPHIN) 2 g in sodium  chloride 0.9 % 100 mL IVPB (2 g Intravenous New Bag/Given 03/26/24 1829)  acetaminophen  (TYLENOL ) tablet 650 mg (650 mg Oral Given 03/26/24 1305)  oxyCODONE -acetaminophen  (PERCOCET/ROXICET) 5-325 MG per tablet 1 tablet (1 tablet Oral Given 03/26/24 1617)  ondansetron  (ZOFRAN -ODT) disintegrating tablet 4 mg (4 mg Oral Given 03/26/24 1617)  iohexol  (OMNIPAQUE ) 300 MG/ML solution 100 mL (100 mLs Intravenous Contrast Given 03/26/24 1651)                                    Medical Decision Making Amount and/or Complexity of Data Reviewed Labs: ordered. Radiology: ordered.  Risk OTC drugs. Prescription drug management.   This patient presents to the ED with chief complaint(s) of abdominal pain with pertinent past medical history of elevated BMI, fibroids.The complaint involves an extensive differential diagnosis and also carries with it a high risk of complications and morbidity.    The differential diagnosis includes : URI including COVID-19, flu, pneumonia, acute cystitis, PID, ectopic pregnancy, diverticulitis, appendicitis, perforated viscus.  Patient noted  to have fever of 103 upon arrival. Plan is to get basic labs, CT abdomen pelvis with contrast.  At this time, clinically does not appear to be septic.   Independent labs interpretation:  The following labs were independently interpreted: Patient has leukocytosis. Labs otherwise reveal no AKI.  Sodium is slightly low, bicarb is slightly low, patient has no anion gap. Pregnancy test is negative, respiratory panel is negative.  UA still pending.  CT has been ordered.  Independent visualization and interpretation of imaging: - I independently visualized the following imaging with scope of interpretation limited to determining acute life threatening conditions related to emergency care: Ct abdomen and pelvis, which revealed no perforation.  Treatment and Reassessment: CT scan reveals pyelonephritis and fibroids.  Results of the ER  discussed with the patient.  She has received IV ceftriaxone in the ED.  We will discharge her with Bactrim twice daily. Urine culture sent. Patient is already passed p.o. challenge.  She feels a lot better.  The patient appears reasonably screened and/or stabilized for discharge and I doubt any other medical condition or other [facility] requiring further screening, evaluation, or treatment in the ED at this time prior to discharge.   Results from the ER workup discussed with the patient face to face and all questions answered to the best of my ability. The patient is safe for discharge with strict return precautions.   Final diagnoses:  Pyelonephritis  Fibroids    ED Discharge Orders          Ordered    sulfamethoxazole-trimethoprim (BACTRIM DS) 800-160 MG tablet  2 times daily        03/26/24 1856    ondansetron  (ZOFRAN -ODT) 8 MG disintegrating tablet  Every 8 hours PRN        03/26/24 1856               Charlyn Sora, MD 03/26/24 1858

## 2024-03-28 LAB — URINE CULTURE: Culture: >=100000 — AB

## 2024-03-29 ENCOUNTER — Telehealth (HOSPITAL_BASED_OUTPATIENT_CLINIC_OR_DEPARTMENT_OTHER): Payer: Self-pay

## 2024-03-29 NOTE — Progress Notes (Signed)
 ED Antimicrobial Stewardship Positive Culture Follow Up   Sherri Butler is an 48 y.o. female who presented to [facility] on 03/26/2024 with a chief complaint of  Chief Complaint  Patient presents with   Abdominal Pain    Recent Results (from the past 720 hours)  Resp panel by RT-PCR (RSV, Flu A&B, Covid) Anterior Nasal Swab     Status: None   Collection Time: 03/26/24  1:02 PM   Specimen: Anterior Nasal Swab  Result Value Ref Range Status   SARS Coronavirus 2 by RT PCR NEGATIVE NEGATIVE Final    Comment: (NOTE) SARS-CoV-2 target nucleic acids are NOT DETECTED.  The SARS-CoV-2 RNA is generally detectable in upper respiratory specimens during the acute phase of infection. The lowest concentration of SARS-CoV-2 viral copies this assay can detect is 138 copies/mL. A negative result does not preclude SARS-Cov-2 infection and should not be used as the sole basis for treatment or other patient management decisions. A negative result may occur with  improper specimen collection/handling, submission of specimen other than nasopharyngeal swab, presence of viral mutation(s) within the areas targeted by this assay, and inadequate number of viral copies(<138 copies/mL). A negative result must be combined with clinical observations, patient history, and epidemiological information. The expected result is Negative.  Fact Sheet for Patients:  BloggerCourse.com  Fact Sheet for Healthcare Providers:  SeriousBroker.it  This test is no t yet approved or cleared by the [place]  FDA and  has been authorized for detection and/or diagnosis of SARS-CoV-2 by FDA under an Emergency Use Authorization (EUA). This EUA will remain  in effect (meaning this test can be used) for the duration of the COVID-19 declaration under Section 564(b)(1) of the Act, 21 U.S.C.section 360bbb-3(b)(1), unless the authorization is terminated  or revoked sooner.        Influenza A by PCR NEGATIVE NEGATIVE Final   Influenza B by PCR NEGATIVE NEGATIVE Final    Comment: (NOTE) The Xpert Xpress SARS-CoV-2/FLU/RSV plus assay is intended as an aid in the diagnosis of influenza from Nasopharyngeal swab specimens and should not be used as a sole basis for treatment. Nasal washings and aspirates are unacceptable for Xpert Xpress SARS-CoV-2/FLU/RSV testing.  Fact Sheet for Patients: BloggerCourse.com  Fact Sheet for Healthcare Providers: SeriousBroker.it  This test is not yet approved or cleared by the [place]  FDA and has been authorized for detection and/or diagnosis of SARS-CoV-2 by FDA under an Emergency Use Authorization (EUA). This EUA will remain in effect (meaning this test can be used) for the duration of the COVID-19 declaration under Section 564(b)(1) of the Act, 21 U.S.C. section 360bbb-3(b)(1), unless the authorization is terminated or revoked.     Resp Syncytial Virus by PCR NEGATIVE NEGATIVE Final    Comment: (NOTE) Fact Sheet for Patients: BloggerCourse.com  Fact Sheet for Healthcare Providers: SeriousBroker.it  This test is not yet approved or cleared by the [place]  FDA and has been authorized for detection and/or diagnosis of SARS-CoV-2 by FDA under an Emergency Use Authorization (EUA). This EUA will remain in effect (meaning this test can be used) for the duration of the COVID-19 declaration under Section 564(b)(1) of the Act, 21 U.S.C. section 360bbb-3(b)(1), unless the authorization is terminated or revoked.  Performed at [facility], [address], [place], [place] [address]   Urine Culture     Status: Abnormal   Collection Time: 03/26/24  1:20 PM   Specimen: Urine, Clean Catch  Result Value Ref Range Status  Specimen Description   Final    URINE, CLEAN CATCH Performed at [facility], [address] [place], [place] [address]    Special Requests   Final    NONE Performed at [facility], [address] [place], [place] [address]    Culture >=100,000 COLONIES/mL ESCHERICHIA COLI (A)  Final   Report Status 03/28/2024 FINAL  Final   Organism ID, Bacteria ESCHERICHIA COLI (A)  Final      Susceptibility   Escherichia coli - MIC*    AMPICILLIN >=32 RESISTANT Resistant     CEFAZOLIN (URINE) Value in next row Sensitive      8 SENSITIVEThis is a modified FDA-approved test that has been validated and its performance characteristics determined by the reporting laboratory.  This laboratory is certified under the Clinical Laboratory Improvement Amendments CLIA as qualified to perform high complexity clinical laboratory testing.    CEFEPIME Value in next row Sensitive      8 SENSITIVEThis is a modified FDA-approved test that has been validated and its performance characteristics determined by the reporting laboratory.  This laboratory is certified under the Clinical Laboratory Improvement Amendments CLIA as qualified to perform high complexity clinical laboratory testing.    ERTAPENEM Value in next row Sensitive      8 SENSITIVEThis is a modified FDA-approved test that has been validated and its performance characteristics determined by the reporting laboratory.  This laboratory is certified under the Clinical Laboratory Improvement Amendments CLIA as qualified to perform high complexity clinical laboratory testing.    CEFTRIAXONE Value in next row Sensitive      8 SENSITIVEThis is a modified FDA-approved test that has been validated and its performance characteristics determined by the reporting laboratory.  This laboratory is certified under the Clinical Laboratory Improvement Amendments CLIA as qualified to perform high complexity clinical laboratory testing.    CIPROFLOXACIN Value in next row Sensitive      8 SENSITIVEThis is a modified  FDA-approved test that has been validated and its performance characteristics determined by the reporting laboratory.  This laboratory is certified under the Clinical Laboratory Improvement Amendments CLIA as qualified to perform high complexity clinical laboratory testing.    GENTAMICIN Value in next row Sensitive      8 SENSITIVEThis is a modified FDA-approved test that has been validated and its performance characteristics determined by the reporting laboratory.  This laboratory is certified under the Clinical Laboratory Improvement Amendments CLIA as qualified to perform high complexity clinical laboratory testing.    NITROFURANTOIN Value in next row Sensitive      8 SENSITIVEThis is a modified FDA-approved test that has been validated and its performance characteristics determined by the reporting laboratory.  This laboratory is certified under the Clinical Laboratory Improvement Amendments CLIA as qualified to perform high complexity clinical laboratory testing.    TRIMETH/SULFA Value in next row Resistant      8 SENSITIVEThis is a modified FDA-approved test that has been validated and its performance characteristics determined by the reporting laboratory.  This laboratory is certified under the Clinical Laboratory Improvement Amendments CLIA as qualified to perform high complexity clinical laboratory testing.    AMPICILLIN/SULBACTAM Value in next row Resistant      8 SENSITIVEThis is a modified FDA-approved test that has been validated and its performance characteristics determined by the reporting laboratory.  This laboratory is certified under the Clinical Laboratory Improvement Amendments CLIA as qualified to perform high complexity clinical laboratory testing.    PIP/TAZO  Value in next row Sensitive      <=4 SENSITIVEThis is a modified FDA-approved test that has been validated and its performance characteristics determined by the reporting laboratory.  This laboratory is certified under the  Clinical Laboratory Improvement Amendments CLIA as qualified to perform high complexity clinical laboratory testing.    MEROPENEM Value in next row Sensitive      <=4 SENSITIVEThis is a modified FDA-approved test that has been validated and its performance characteristics determined by the reporting laboratory.  This laboratory is certified under the Clinical Laboratory Improvement Amendments CLIA as qualified to perform high complexity clinical laboratory testing.    * >=100,000 COLONIES/mL ESCHERICHIA COLI    [x]  Treated with Bactrim DS 800-400 mg BID, organism resistant to prescribed antimicrobial []  Patient discharged originally without antimicrobial agent and treatment is now indicated  Plan: Stop Bactrim Start Ciprofloxacin 500 mg q12h  New antibiotic prescription: Ciprofloxacin 500 mg q12h  ED Provider: Lynwood Moulder, MD   Axcel Horsch T Torrion Witter 03/29/2024, 10:39 AM Clinical Pharmacist Monday - Friday phone -  [phone] Saturday - Sunday phone - [phone]

## 2024-03-29 NOTE — Telephone Encounter (Signed)
 Post ED Visit - Positive Culture Follow-up: Successful Patient Follow-Up  Culture assessed and recommendations reviewed by:  [x]  Damien Quiet, Pharm.D. []  Venetia Gully, Pharm.D., BCPS AQ-ID []  Garrel Crews, Pharm.D., BCPS []  Almarie Lunger, Pharm.D., BCPS []  [place], [address].D., BCPS, AAHIVP []  Rosaline Bihari, Pharm.D., BCPS, AAHIVP []  Vernell Meier, PharmD, BCPS []  Latanya Hint, PharmD, BCPS []  Donald Medley, PharmD, BCPS []  Rocky Bold, PharmD  Positive urine culture  []  Patient discharged without antimicrobial prescription and treatment is now indicated [x]  Organism is resistant to prescribed ED discharge antimicrobial []  Patient with positive blood cultures  Changes discussed with ED provider: Lynwood Moulder, MD New antibiotic prescription Cipro 500 mg po BID x 7 days Called to [facility] on [address]  Contacted patient, date 03/29/2024, time 1:45 pm   Ruth Camelia Elbe 03/29/2024, 2:34 PM

## 2024-04-05 ENCOUNTER — Ambulatory Visit: Payer: Self-pay | Admitting: Internal Medicine

## 2024-04-05 ENCOUNTER — Encounter: Payer: Self-pay | Admitting: Internal Medicine

## 2024-04-05 VITALS — BP 122/100 | HR 66 | Resp 18 | Ht 63.0 in | Wt 217.0 lb

## 2024-04-05 DIAGNOSIS — I10 Essential (primary) hypertension: Secondary | ICD-10-CM | POA: Insufficient documentation

## 2024-04-05 DIAGNOSIS — Z23 Encounter for immunization: Secondary | ICD-10-CM

## 2024-04-05 DIAGNOSIS — Z0120 Encounter for dental examination and cleaning without abnormal findings: Secondary | ICD-10-CM

## 2024-04-05 DIAGNOSIS — Z0000 Encounter for general adult medical examination without abnormal findings: Secondary | ICD-10-CM

## 2024-04-05 MED ORDER — LISINOPRIL 10 MG PO TABS: 10.0000 mg | ORAL_TABLET | Freq: Every day | ORAL | 11 refills | Status: AC

## 2024-04-05 NOTE — Progress Notes (Unsigned)
 Subjective:    Patient ID: Sherri Butler, female   DOB: Jan 03, 1976, 48 y.o.   MRN: [id number]   HPI  CPE without pap  1.  Pap:  Last 03/2023 and always normal.    2.  Mammogram:  Last 07/2023 with stable benign right breast mass.  No family history of breast cancer.    3.  Osteoprevention:  Taking Calcium  and Vitamin D  twice daily.  Not physically active.  Walks daily 1 mile daily in afternoon.  She is willing to try adding another mile.    4.  Guaiac Cards/FIT:  + in 2023 without follow up.  Did not return last year.  5.  Colonoscopy:  Never.  No family history of colon cancer.    6.  Immunizations:  Needs influenza and COVID.   Immunization History  Administered Date(s) Administered   IPV 07/27/2023   Influenza,inj,Quad PF,6+ Mos 06/05/2016   Influenza-Unspecified 04/29/2021   Janssen (J&J) SARS-COV-2 Vaccination 09/17/2019   MMR 07/27/2023   Moderna Covid-19 Fall Seasonal Vaccine 17yrs & older 04/06/2023   Moderna Covid-19 Vaccine  Bivalent Booster 20yrs & up 12/02/2021   Tdap 06/29/2014   Varicella 07/27/2023     7.  Glucose/Cholesterol:  Sugar at 141 when in ED on 9/28 for pyelonephritis.  A1C last year 5.7% early prediabetic range.   Cholesterol was acceptable last year. Lipid Panel     Component Value Date/Time   CHOL 181 04/23/2023 0829   TRIG 45 04/23/2023 0829   HDL 50 04/23/2023 0829   LDLCALC 122 (H) 04/23/2023 0829   LABVLDL 9 04/23/2023 0829     Current Meds  Medication Sig   Ascorbic Acid (VITAMIN C GUMMIE PO) Take by mouth.   Calcium  Citrate-Vitamin D  250-5 MG-MCG TABS 500 mg of calcium  citrate twice daily   ibuprofen  (ADVIL ) 800 MG tablet Take 1 tablet (800 mg total) by mouth every 8 (eight) hours as needed.   levonorgestrel (MIRENA) 20 MCG/DAY IUD 1 each by Intrauterine route once. December 2022 with [facility].   ondansetron  (ZOFRAN -ODT) 8 MG disintegrating tablet Take 1 tablet (8 mg total) by mouth every 8 (eight) hours as needed for nausea.   No  Known Allergies  Past Medical History:  Diagnosis Date   Fibroids 2022   Obesity (BMI 35.0-39.9 without comorbidity) 2020   Pregnancy induced hypertension 2016   Pyelonephritis 03/26/2024   left--treated outpatient   Past Surgical History:  Procedure Laterality Date   DILATION AND EVACUATION N/A 10/30/2020   Procedure: DILATATION AND EVACUATION;  Surgeon: Lorence Ozell CROME, MD;  Location: [facility];  Service: Gynecology;  Laterality: N/A;   IUD REMOVAL N/A 02/21/2020   Procedure: INTRAUTERINE DEVICE (IUD) REMOVAL;  Surgeon: Izell Harari, MD;  Location: [facility];  Service: Gynecology;  Laterality: N/A;   Family History  Problem Relation Age of Onset   Hypertension Father        maybe had a stroke or MI   Breast cancer Neg Hx    Social History   Socioeconomic History   Marital status: Media planner    Spouse name: Gerrie   Number of children: 4   Years of education: finished high school in [place]   Highest education level: Not on file  Occupational History   Occupation: Event organiser: [employer]  Tobacco Use   Smoking status: Never    Passive exposure: Never   Smokeless tobacco: Never  Vaping Use   Vaping status: Never Used  Substance and Sexual Activity   Alcohol use: No   Drug use: No   Sexual activity: Yes    Birth control/protection: I.U.D.  Other Topics Concern   Not on file  Social History Narrative   Lives at home with her boyfriend, who is father of youngest child, and her 4 children   Father of other children lives in [place] not provide any support.   Social Drivers of Corporate investment banker Strain: Low Risk  (04/05/2024)   Overall Financial Resource Strain (CARDIA)    Difficulty of Paying Living Expenses: Not very hard  Food Insecurity: No Food Insecurity (04/05/2024)   Hunger Vital Sign    Worried About Running Out of Food in the Last Year: Never true    Ran Out of Food in the Last Year: Never true   Transportation Needs: No Transportation Needs (04/05/2024)   PRAPARE - Administrator, Civil Service (Medical): No    Lack of Transportation (Non-Medical): No  Physical Activity: Insufficiently Active (02/07/2019)   Exercise Vital Sign    Days of Exercise per Week: 5 days    Minutes of Exercise per Session: 20 min  Stress: Not on file  Social Connections: Not on file  Intimate Partner Violence: Not At Risk (04/05/2024)   Humiliation, Afraid, Rape, and Kick questionnaire    Fear of Current or Ex-Partner: No    Emotionally Abused: No    Physically Abused: No    Sexually Abused: No      Review of Systems  Eyes:  Negative for visual disturbance (Did get glasses.).  Respiratory:  Negative for shortness of breath.   Cardiovascular:  Negative for chest pain, palpitations and leg swelling.  Gastrointestinal:  Negative for abdominal pain and blood in stool.  Genitourinary:  Negative for vaginal discharge.       Left pyelonephritis:  Seen in ED 9/28 for this.  She was on Bactrim initially , but culture ID for E coli showed resistance and switched to Cipro.  She is doing much better.  Today is last day of Cipro.        Objective:   BP (!) 122/100 (BP Location: Left Arm, Patient Position: Sitting, Cuff Size: Normal)   Pulse 66   Resp 18   Ht 5' 3 (1.6 m)   Wt 217 lb (98.4 kg)   LMP 03/27/2024 (Exact Date)   BMI 38.44 kg/m   Physical Exam  Pelvic only--exam normal Assessment & Plan

## 2024-04-21 ENCOUNTER — Other Ambulatory Visit: Payer: Self-pay

## 2024-05-19 ENCOUNTER — Other Ambulatory Visit: Payer: Self-pay

## 2024-08-10 ENCOUNTER — Ambulatory Visit: Payer: Self-pay | Admitting: Internal Medicine
